# Patient Record
Sex: Female | Born: 1965 | Race: Black or African American | Hispanic: No | Smoking: Current every day smoker
Health system: Southern US, Community
[De-identification: ages and names within clinical notes are randomized; demographics above are authoritative.]

## PROBLEM LIST (undated history)

## (undated) DIAGNOSIS — C549 Malignant neoplasm of corpus uteri, unspecified: Secondary | ICD-10-CM

## (undated) DIAGNOSIS — Z5189 Encounter for other specified aftercare: Secondary | ICD-10-CM

## (undated) DIAGNOSIS — C3490 Malignant neoplasm of unspecified part of unspecified bronchus or lung: Secondary | ICD-10-CM

## (undated) DIAGNOSIS — C801 Malignant (primary) neoplasm, unspecified: Secondary | ICD-10-CM

## (undated) DIAGNOSIS — C55 Malignant neoplasm of uterus, part unspecified: Secondary | ICD-10-CM

## (undated) DIAGNOSIS — I10 Essential (primary) hypertension: Secondary | ICD-10-CM

## (undated) DIAGNOSIS — F319 Bipolar disorder, unspecified: Secondary | ICD-10-CM

## (undated) HISTORY — PX: FRACTURE SURGERY: SHX138

## (undated) HISTORY — PX: MANDIBLE FRACTURE SURGERY: SHX706

---

## 2016-05-28 ENCOUNTER — Ambulatory Visit (HOSPITAL_COMMUNITY): Payer: Self-pay

## 2016-06-01 ENCOUNTER — Encounter (HOSPITAL_COMMUNITY): Payer: Self-pay | Admitting: Emergency Medicine

## 2016-06-01 ENCOUNTER — Emergency Department (HOSPITAL_COMMUNITY)
Admission: EM | Admit: 2016-06-01 | Discharge: 2016-06-01 | Disposition: A | Discharge status: Home / Self Care | Payer: Medicare Other | Attending: Emergency Medicine | Admitting: Emergency Medicine

## 2016-06-01 ENCOUNTER — Emergency Department (HOSPITAL_COMMUNITY): Payer: Medicare Other

## 2016-06-01 DIAGNOSIS — C55 Malignant neoplasm of uterus, part unspecified: Secondary | ICD-10-CM

## 2016-06-01 DIAGNOSIS — C541 Malignant neoplasm of endometrium: Secondary | ICD-10-CM | POA: Diagnosis not present

## 2016-06-01 DIAGNOSIS — R1032 Left lower quadrant pain: Secondary | ICD-10-CM | POA: Diagnosis present

## 2016-06-01 DIAGNOSIS — I10 Essential (primary) hypertension: Secondary | ICD-10-CM | POA: Insufficient documentation

## 2016-06-01 DIAGNOSIS — F17210 Nicotine dependence, cigarettes, uncomplicated: Secondary | ICD-10-CM | POA: Diagnosis not present

## 2016-06-01 HISTORY — DX: Essential (primary) hypertension: I10

## 2016-06-01 LAB — COMPREHENSIVE METABOLIC PANEL
ALBUMIN: 2.7 g/dL — AB (ref 3.5–5.0)
ALT: 6 U/L — AB (ref 14–54)
AST: 9 U/L — AB (ref 15–41)
Alkaline Phosphatase: 79 U/L (ref 38–126)
Anion gap: 10 (ref 5–15)
BILIRUBIN TOTAL: 0.5 mg/dL (ref 0.3–1.2)
BUN: 9 mg/dL (ref 6–20)
CHLORIDE: 96 mmol/L — AB (ref 101–111)
CO2: 26 mmol/L (ref 22–32)
CREATININE: 0.42 mg/dL — AB (ref 0.44–1.00)
Calcium: 8.9 mg/dL (ref 8.9–10.3)
GFR calc Af Amer: >60 mL/min (ref >60)
Glucose, Bld: 91 mg/dL (ref 65–99)
POTASSIUM: 3.5 mmol/L (ref 3.5–5.1)
Sodium: 132 mmol/L — ABNORMAL LOW (ref 135–145)
TOTAL PROTEIN: 7.4 g/dL (ref 6.5–8.1)

## 2016-06-01 LAB — URINALYSIS, ROUTINE W REFLEX MICROSCOPIC
Bilirubin Urine: NEGATIVE
GLUCOSE, UA: NEGATIVE mg/dL
KETONES UR: NEGATIVE mg/dL
Nitrite: NEGATIVE
PROTEIN: NEGATIVE mg/dL
Specific Gravity, Urine: 1.015 (ref 1.005–1.030)
pH: 6 (ref 5.0–8.0)

## 2016-06-01 LAB — CBC WITH DIFFERENTIAL/PLATELET
BASOS ABS: 0 10*3/uL (ref 0.0–0.1)
BASOS PCT: 0 %
Eosinophils Absolute: 0 10*3/uL (ref 0.0–0.7)
Eosinophils Relative: 0 %
HEMATOCRIT: 28.4 % — AB (ref 36.0–46.0)
Hemoglobin: 9.1 g/dL — ABNORMAL LOW (ref 12.0–15.0)
LYMPHS PCT: 9 %
Lymphs Abs: 2.1 10*3/uL (ref 0.7–4.0)
MCH: 26 pg (ref 26.0–34.0)
MCHC: 32 g/dL (ref 30.0–36.0)
MCV: 81.1 fL (ref 78.0–100.0)
Monocytes Absolute: 1.7 10*3/uL — ABNORMAL HIGH (ref 0.1–1.0)
Monocytes Relative: 7 %
NEUTROS ABS: 21.1 10*3/uL — AB (ref 1.7–7.7)
NEUTROS PCT: 84 %
Platelets: 535 10*3/uL — ABNORMAL HIGH (ref 150–400)
RBC: 3.5 MIL/uL — AB (ref 3.87–5.11)
RDW: 16.6 % — ABNORMAL HIGH (ref 11.5–15.5)
WBC: 25 10*3/uL — AB (ref 4.0–10.5)

## 2016-06-01 MED ORDER — IOPAMIDOL (ISOVUE-300) INJECTION 61%
INTRAVENOUS | Status: AC
  Administered 2016-06-01: 30 mL
  Filled 2016-06-01: qty 30

## 2016-06-01 MED ORDER — DOCUSATE SODIUM 100 MG PO CAPS: 100.0000 mg | ORAL_CAPSULE | Freq: Two times a day (BID) | ORAL | 0 refills | Status: AC

## 2016-06-01 MED ORDER — ONDANSETRON HCL 4 MG/2ML IJ SOLN
4.0000 mg | Freq: Once | INTRAMUSCULAR | Status: AC
  Administered 2016-06-01: 4 mg via INTRAVENOUS
  Filled 2016-06-01: qty 2

## 2016-06-01 MED ORDER — IOPAMIDOL (ISOVUE-300) INJECTION 61%
100.0000 mL | Freq: Once | INTRAVENOUS | Status: AC | PRN
  Administered 2016-06-01: 100 mL via INTRAVENOUS

## 2016-06-01 MED ORDER — OXYCODONE-ACETAMINOPHEN 5-325 MG PO TABS: 1.0000 | ORAL_TABLET | Freq: Three times a day (TID) | ORAL | 0 refills | Status: DC | PRN

## 2016-06-01 MED ORDER — OXYCODONE-ACETAMINOPHEN 5-325 MG PO TABS
1.0000 | ORAL_TABLET | Freq: Once | ORAL | Status: AC
  Administered 2016-06-01: 1 via ORAL
  Filled 2016-06-01: qty 1

## 2016-06-01 MED ORDER — HYDROMORPHONE HCL 1 MG/ML IJ SOLN
1.0000 mg | Freq: Once | INTRAMUSCULAR | Status: AC
  Administered 2016-06-01: 1 mg via INTRAVENOUS
  Filled 2016-06-01: qty 1

## 2016-06-01 MED ORDER — SODIUM CHLORIDE 0.9 % IV BOLUS (SEPSIS)
1000.0000 mL | Freq: Once | INTRAVENOUS | Status: AC
  Administered 2016-06-01: 1000 mL via INTRAVENOUS

## 2016-06-01 NOTE — ED Provider Notes (Signed)
[facility] DEPT Provider Note   CSN: [id number] Arrival date & time: 06/01/16  0920  By signing my name below, I, Mayer Masker, attest that this documentation has been prepared under the direction and in the presence of Merrily Pew, MD. Electronically Signed: Mayer Masker, Scribe. 06/01/16. 12:02 PM.  History   Chief Complaint Chief Complaint  Patient presents with  . Abdominal Pain   HPI Comments: Valerie Gonzales is a 51 y.o. female with a h/o bipolar disorder and uterine cancer who presents to the Emergency Department complaining of lower abdominal pain with greater pain to the LLQ abdomen x ~2-3 months. Pt notes her pain is worse during the night ~3 am. She states the pain is worsening and it also radiates around to her back.She states she had a syncopal episode in February 2018 and her pain started then She reports associated green and yellow vaginal discharge, intermittent nausea, weight loss, and decreased appetite. She denies difficulty urinating, dysuria, fever, chills, diaphoresis, vision changes, CP, vomiting, diarrhea, constipation, rashes, and numbness/tingling.  Patient was in the ED at [facility] 3 times for her symptoms. One of her visits resulted in an admission where she received a blood transfusion. 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 Pt recently completed a regimen of doxycycline. She also states she is having difficulty attending her oncologist appointments due to lack of transportation. Pt is currently homeless.       The history is provided by the patient. No language interpreter was used.    Past Medical History:  Diagnosis Date  . Hypertension     There are no active problems to display for this patient.   Past Surgical History:  Procedure Laterality Date  . CESAREAN SECTION    . DILATION AND CURETTAGE OF UTERUS    . MANDIBLE FRACTURE SURGERY      OB History    Gravida Para Term  Preterm AB Living   3       0 3   SAB TAB Ectopic Multiple Live Births   0               Home Medications    Prior to Admission medications   Medication Sig Start Date End Date Taking? Authorizing Provider  docusate sodium (COLACE) 100 MG capsule Take 1 capsule (100 mg total) by mouth every 12 (twelve) hours. 06/01/16   Merrily Pew, MD  oxyCODONE-acetaminophen (PERCOCET) 5-325 MG tablet Take 1-2 tablets by mouth every 8 (eight) hours as needed. 06/01/16   Merrily Pew, MD    Family History History reviewed. No pertinent family history.  Social History Social History  Substance Use Topics  . Smoking status: Current Every Day Smoker    Packs/day: 0.50    Types: Cigarettes  . Smokeless tobacco: Never Used  . Alcohol use No     Allergies   Patient has no known allergies.   Review of Systems Review of Systems  Constitutional: Positive for appetite change. Negative for chills, diaphoresis and fever.  Eyes: Negative for visual disturbance.  Cardiovascular: Negative for chest pain.  Gastrointestinal: Positive for abdominal pain (LLQ) and nausea (intermittent). Negative for constipation, diarrhea and vomiting.  Genitourinary: Positive for vaginal discharge (green and yellow). Negative for difficulty urinating and dysuria.  Musculoskeletal: Positive for back pain.  Skin: Negative for rash.  Neurological: Negative for numbness.     Physical Exam Updated Vital Signs BP (!) 156/92 (BP Location: Left Arm)  Pulse 90   Temp 98.3 F (36.8 C) (Oral)   Resp 20   Ht '5\' 3"'$  (1.6 m)   Wt 150 lb (68 kg)   SpO2 97%   BMI 26.57 kg/m   Physical Exam  Constitutional: She is oriented to person, place, and time. She appears well-developed and well-nourished.  HENT:  Head: Normocephalic and atraumatic.  Cardiovascular: Normal rate, regular rhythm and intact distal pulses.  Exam reveals no gallop and no friction rub.   Murmur heard. 3/6 systolic murmur; otherwise normal     Pulmonary/Chest: Effort normal and breath sounds normal. No respiratory distress. She has no wheezes. She has no rales. She exhibits no tenderness.  Abdominal: Soft. She exhibits mass. There is tenderness. There is guarding.  Diffuse tenderness  Worse epigastric and suprapubic region Some guarding in suprapubic area  Palpable Tender mass midline from umbilicus    Neurological: She is alert and oriented to person, place, and time.  Skin: Skin is warm and dry.  Psychiatric: She has a normal mood and affect.  Nursing note and vitals reviewed.    ED Treatments / Results  DIAGNOSTIC STUDIES: Oxygen Saturation is 98% on RA, normal by my interpretation.    COORDINATION OF CARE: 10:44 AM Discussed treatment plan with pt at bedside and pt agreed to plan.  Labs (all labs ordered are listed, but only abnormal results are displayed) Labs Reviewed  CBC WITH DIFFERENTIAL/PLATELET - Abnormal; Notable for the following:       Result Value   WBC 25.0 (*)    RBC 3.50 (*)    Hemoglobin 9.1 (*)    HCT 28.4 (*)    RDW 16.6 (*)    Platelets 535 (*)    Neutro Abs 21.1 (*)    Monocytes Absolute 1.7 (*)    All other components within normal limits  COMPREHENSIVE METABOLIC PANEL - Abnormal; Notable for the following:    Sodium 132 (*)    Chloride 96 (*)    Creatinine, Ser 0.42 (*)    Albumin 2.7 (*)    AST 9 (*)    ALT 6 (*)    All other components within normal limits  URINALYSIS, ROUTINE W REFLEX MICROSCOPIC - Abnormal; Notable for the following:    Hgb urine dipstick SMALL (*)    Leukocytes, UA LARGE (*)    Bacteria, UA RARE (*)    Squamous Epithelial / LPF 0-5 (*)    All other components within normal limits    EKG  EKG Interpretation None       Radiology Ct Chest W Contrast  Result Date: 06/01/2016 CLINICAL DATA:  Lower abdominal pain for 2-3 months. Vaginal discharge. Nausea and weight loss. History of metastatic endometrial cancer, on treatment for palliative radiation.  EXAM: CT CHEST, ABDOMEN, AND PELVIS WITH CONTRAST TECHNIQUE: Multidetector CT imaging of the chest, abdomen and pelvis was performed following the standard protocol during bolus administration of intravenous contrast. CONTRAST:  66m ISOVUE-300 IOPAMIDOL (ISOVUE-300) INJECTION 61%, 1016mISOVUE-300 IOPAMIDOL (ISOVUE-300) INJECTION 61% COMPARISON:  CT abdomen and pelvis May 17, 2016 FINDINGS: CT CHEST FINDINGS CARDIOVASCULAR: Heart size is normal. Small pericardial effusion. Thoracic aorta is normal course and caliber, unremarkable. MEDIASTINUM/NODES: No mediastinal mass. No lymphadenopathy by CT size criteria. Normal appearance of thoracic esophagus though not tailored for evaluation. LUNGS/PLEURA: Tracheobronchial tree is patent, no pneumothorax. At least 4 RIGHT and 9 LEFT hypoenhancing pulmonary metastasis, largest in RIGHT lower lobe measuring 5 x 4.2 cm, largest on the LEFT measuring 3.8  x 3 cm and lower lobes. Masses are predominately subpleural. No pleural effusion or focal consolidation. Bibasilar atelectasis. MUSCULOSKELETAL: Partially imaged breast demonstrate bulky density, possible masses. Moderate degenerative change of LEFT shoulder. No acute osseous process or destructive bony lesions. CT ABDOMEN AND PELVIS FINDINGS HEPATOBILIARY: Liver and gallbladder are normal. PANCREAS: Normal. SPLEEN: Normal. ADRENALS/URINARY TRACT: Kidneys are orthotopic, demonstrating symmetric enhancement. No nephrolithiasis, hydronephrosis or solid renal masses. Subcentimeter RIGHT lower pole probable an malalignment. The unopacified ureters are normal in course and caliber. Delayed imaging through the kidneys demonstrates symmetric prompt contrast excretion within the proximal urinary collecting system. Urinary bladder is partially distended and unremarkable. Normal adrenal glands. STOMACH/BOWEL: The stomach, small and large bowel are normal in course and caliber without inflammatory changes. Normal appendix.  VASCULAR/LYMPHATIC: Aortoiliac vessels are normal in course and caliber. Retroperitoneal lymphadenopathy, including 2.9 x 2.3 cm LEFT periaortic lymph node, smaller LEFT greater than RIGHT iliac chain lymph nodes. 3.3 cm LEFT sidewall lymph node with smaller bilateral internal and external iliac chain lymphadenopathy. REPRODUCTIVE: Fluid-filled vagina. Distended irregular endometrium to at least 7.2 cm with probable hematocrit level. Uterus is enlarged, at least 2.1 cm in cranial caudad dimension extending above the emboli kiss. Status post tubal ligation. Small calcified uterine leiomyoma. Probable 2.1 cm LEFT adnexal cyst. OTHER: No intraperitoneal free fluid or free air. MUSCULOSKELETAL: L5 lytic lesion with less than 25% ventral height loss, with new linear lucency through inferior endplate. Severe lower lumbar facet arthropathy. IMPRESSION: CT CHEST: Multiple old pulmonary metastasis without acute cardiopulmonary process. Possible breast masses, suspicious for metastatic disease. CT ABDOMEN AND PELVIS: Enlarged uterus with distended endometrium, increased from prior CT consistent with progression of patient's known primary endometrial carcinoma. Retroperitoneal/iliac chain lymphadenopathy consistent with metastatic disease. New mild L5 pathologic fracture. Electronically Signed   By: Elon Alas M.D.   On: 06/01/2016 15:52   Ct Abdomen Pelvis W Contrast  Result Date: 06/01/2016 CLINICAL DATA:  Lower abdominal pain for 2-3 months. Vaginal discharge. Nausea and weight loss. History of metastatic endometrial cancer, on treatment for palliative radiation. EXAM: CT CHEST, ABDOMEN, AND PELVIS WITH CONTRAST TECHNIQUE: Multidetector CT imaging of the chest, abdomen and pelvis was performed following the standard protocol during bolus administration of intravenous contrast. CONTRAST:  65m ISOVUE-300 IOPAMIDOL (ISOVUE-300) INJECTION 61%, 106mISOVUE-300 IOPAMIDOL (ISOVUE-300) INJECTION 61% COMPARISON:  CT  abdomen and pelvis May 17, 2016 FINDINGS: CT CHEST FINDINGS CARDIOVASCULAR: Heart size is normal. Small pericardial effusion. Thoracic aorta is normal course and caliber, unremarkable. MEDIASTINUM/NODES: No mediastinal mass. No lymphadenopathy by CT size criteria. Normal appearance of thoracic esophagus though not tailored for evaluation. LUNGS/PLEURA: Tracheobronchial tree is patent, no pneumothorax. At least 4 RIGHT and 9 LEFT hypoenhancing pulmonary metastasis, largest in RIGHT lower lobe measuring 5 x 4.2 cm, largest on the LEFT measuring 3.8 x 3 cm and lower lobes. Masses are predominately subpleural. No pleural effusion or focal consolidation. Bibasilar atelectasis. MUSCULOSKELETAL: Partially imaged breast demonstrate bulky density, possible masses. Moderate degenerative change of LEFT shoulder. No acute osseous process or destructive bony lesions. CT ABDOMEN AND PELVIS FINDINGS HEPATOBILIARY: Liver and gallbladder are normal. PANCREAS: Normal. SPLEEN: Normal. ADRENALS/URINARY TRACT: Kidneys are orthotopic, demonstrating symmetric enhancement. No nephrolithiasis, hydronephrosis or solid renal masses. Subcentimeter RIGHT lower pole probable an malalignment. The unopacified ureters are normal in course and caliber. Delayed imaging through the kidneys demonstrates symmetric prompt contrast excretion within the proximal urinary collecting system. Urinary bladder is partially distended and unremarkable. Normal adrenal glands. STOMACH/BOWEL: The stomach, small and large  bowel are normal in course and caliber without inflammatory changes. Normal appendix. VASCULAR/LYMPHATIC: Aortoiliac vessels are normal in course and caliber. Retroperitoneal lymphadenopathy, including 2.9 x 2.3 cm LEFT periaortic lymph node, smaller LEFT greater than RIGHT iliac chain lymph nodes. 3.3 cm LEFT sidewall lymph node with smaller bilateral internal and external iliac chain lymphadenopathy. REPRODUCTIVE: Fluid-filled vagina. Distended  irregular endometrium to at least 7.2 cm with probable hematocrit level. Uterus is enlarged, at least 2.1 cm in cranial caudad dimension extending above the emboli kiss. Status post tubal ligation. Small calcified uterine leiomyoma. Probable 2.1 cm LEFT adnexal cyst. OTHER: No intraperitoneal free fluid or free air. MUSCULOSKELETAL: L5 lytic lesion with less than 25% ventral height loss, with new linear lucency through inferior endplate. Severe lower lumbar facet arthropathy. IMPRESSION: CT CHEST: Multiple old pulmonary metastasis without acute cardiopulmonary process. Possible breast masses, suspicious for metastatic disease. CT ABDOMEN AND PELVIS: Enlarged uterus with distended endometrium, increased from prior CT consistent with progression of patient's known primary endometrial carcinoma. Retroperitoneal/iliac chain lymphadenopathy consistent with metastatic disease. New mild L5 pathologic fracture. Electronically Signed   By: Elon Alas M.D.   On: 06/01/2016 15:52    Procedures Procedures (including critical care time)  Medications Ordered in ED Medications  sodium chloride 0.9 % bolus 1,000 mL (0 mLs Intravenous Stopped 06/01/16 1505)  ondansetron (ZOFRAN) injection 4 mg (4 mg Intravenous Given 06/01/16 1031)  HYDROmorphone (DILAUDID) injection 1 mg (1 mg Intravenous Given 06/01/16 1032)  iopamidol (ISOVUE-300) 61 % injection (30 mLs  Contrast Given 06/01/16 1513)  iopamidol (ISOVUE-300) 61 % injection 100 mL (100 mLs Intravenous Contrast Given 06/01/16 1514)  oxyCODONE-acetaminophen (PERCOCET/ROXICET) 5-325 MG per tablet 1 tablet (1 tablet Oral Given 06/01/16 1641)     Initial Impression / Assessment and Plan / ED Course  I have reviewed the triage vital signs and the nursing notes.  Pertinent labs & imaging results that were available during my care of the patient were reviewed by me and considered in my medical decision making (see chart for details).     Worsening cancer. Pain meds  rx. Needs to see oncology. No acute indication for admission.   Final Clinical Impressions(s) / ED Diagnoses   Final diagnoses:  Malignant neoplasm of uterus, unspecified site [facility])    New Prescriptions New Prescriptions   DOCUSATE SODIUM (COLACE) 100 MG CAPSULE    Take 1 capsule (100 mg total) by mouth every 12 (twelve) hours.   OXYCODONE-ACETAMINOPHEN (PERCOCET) 5-325 MG TABLET    Take 1-2 tablets by mouth every 8 (eight) hours as needed.   I personally performed the services described in this documentation, which was scribed in my presence. The recorded information has been reviewed and is accurate.      Merrily Pew, MD 06/01/16 1650

## 2016-06-01 NOTE — ED Notes (Signed)
Patient ambulated to bathroom with no assistance or difficulty.

## 2016-06-01 NOTE — ED Notes (Signed)
Gave patient ice chips as requested and approved by Dr Dayna Barker.

## 2016-06-01 NOTE — ED Triage Notes (Signed)
PT states recent [facility] ED visits x3 with new dx of uterine cancer and unknown if other metastasis areas. PT states unbearable pain in the past few days and pt is unsure who to follow up with for possible cancer treatments.

## 2016-06-11 ENCOUNTER — Other Ambulatory Visit (HOSPITAL_COMMUNITY): Payer: Self-pay | Admitting: Oncology

## 2016-06-11 ENCOUNTER — Emergency Department (HOSPITAL_COMMUNITY): Payer: Medicare Other

## 2016-06-11 ENCOUNTER — Encounter (HOSPITAL_COMMUNITY): Payer: Self-pay

## 2016-06-11 ENCOUNTER — Inpatient Hospital Stay (HOSPITAL_COMMUNITY)
Admission: EM | Admit: 2016-06-11 | Discharge: 2016-06-16 | DRG: 854 | Disposition: A | Discharge status: Home / Self Care | Payer: Medicare Other | Attending: Internal Medicine | Admitting: Internal Medicine

## 2016-06-11 DIAGNOSIS — Z85118 Personal history of other malignant neoplasm of bronchus and lung: Secondary | ICD-10-CM | POA: Diagnosis not present

## 2016-06-11 DIAGNOSIS — Z515 Encounter for palliative care: Secondary | ICD-10-CM | POA: Diagnosis not present

## 2016-06-11 DIAGNOSIS — D649 Anemia, unspecified: Secondary | ICD-10-CM | POA: Diagnosis present

## 2016-06-11 DIAGNOSIS — I10 Essential (primary) hypertension: Secondary | ICD-10-CM | POA: Diagnosis present

## 2016-06-11 DIAGNOSIS — C549 Malignant neoplasm of corpus uteri, unspecified: Secondary | ICD-10-CM | POA: Diagnosis present

## 2016-06-11 DIAGNOSIS — D72829 Elevated white blood cell count, unspecified: Secondary | ICD-10-CM | POA: Diagnosis present

## 2016-06-11 DIAGNOSIS — Z590 Homelessness: Secondary | ICD-10-CM | POA: Diagnosis not present

## 2016-06-11 DIAGNOSIS — M8448XA Pathological fracture, other site, initial encounter for fracture: Secondary | ICD-10-CM | POA: Diagnosis present

## 2016-06-11 DIAGNOSIS — A419 Sepsis, unspecified organism: Principal | ICD-10-CM | POA: Diagnosis present

## 2016-06-11 DIAGNOSIS — F17210 Nicotine dependence, cigarettes, uncomplicated: Secondary | ICD-10-CM | POA: Diagnosis present

## 2016-06-11 DIAGNOSIS — F319 Bipolar disorder, unspecified: Secondary | ICD-10-CM | POA: Diagnosis present

## 2016-06-11 DIAGNOSIS — N719 Inflammatory disease of uterus, unspecified: Secondary | ICD-10-CM | POA: Diagnosis present

## 2016-06-11 DIAGNOSIS — C541 Malignant neoplasm of endometrium: Secondary | ICD-10-CM

## 2016-06-11 DIAGNOSIS — Z7189 Other specified counseling: Secondary | ICD-10-CM

## 2016-06-11 DIAGNOSIS — R197 Diarrhea, unspecified: Secondary | ICD-10-CM

## 2016-06-11 DIAGNOSIS — C7982 Secondary malignant neoplasm of genital organs: Secondary | ICD-10-CM | POA: Diagnosis present

## 2016-06-11 DIAGNOSIS — R112 Nausea with vomiting, unspecified: Secondary | ICD-10-CM | POA: Diagnosis present

## 2016-06-11 DIAGNOSIS — R1084 Generalized abdominal pain: Secondary | ICD-10-CM

## 2016-06-11 HISTORY — DX: Malignant neoplasm of corpus uteri, unspecified: C54.9

## 2016-06-11 HISTORY — DX: Bipolar disorder, unspecified: F31.9

## 2016-06-11 HISTORY — DX: Malignant neoplasm of unspecified part of unspecified bronchus or lung: C34.90

## 2016-06-11 HISTORY — DX: Encounter for other specified aftercare: Z51.89

## 2016-06-11 HISTORY — DX: Malignant (primary) neoplasm, unspecified: C80.1

## 2016-06-11 HISTORY — DX: Malignant neoplasm of uterus, part unspecified: C55

## 2016-06-11 LAB — LACTIC ACID, PLASMA
Lactic Acid, Venous: 2.5 mmol/L (ref 0.5–1.9)
Lactic Acid, Venous: 2.6 mmol/L (ref 0.5–1.9)

## 2016-06-11 LAB — URINALYSIS, ROUTINE W REFLEX MICROSCOPIC
Glucose, UA: NEGATIVE mg/dL
Hgb urine dipstick: NEGATIVE
KETONES UR: 5 mg/dL — AB
Nitrite: NEGATIVE
PH: 5 (ref 5.0–8.0)
Protein, ur: 30 mg/dL — AB
Specific Gravity, Urine: 1.045 — ABNORMAL HIGH (ref 1.005–1.030)

## 2016-06-11 LAB — COMPREHENSIVE METABOLIC PANEL
ALBUMIN: 2.5 g/dL — AB (ref 3.5–5.0)
ALT: 6 U/L — AB (ref 14–54)
AST: 13 U/L — AB (ref 15–41)
Alkaline Phosphatase: 94 U/L (ref 38–126)
Anion gap: 11 (ref 5–15)
BILIRUBIN TOTAL: 0.5 mg/dL (ref 0.3–1.2)
BUN: 7 mg/dL (ref 6–20)
CHLORIDE: 98 mmol/L — AB (ref 101–111)
CO2: 24 mmol/L (ref 22–32)
CREATININE: 0.49 mg/dL (ref 0.44–1.00)
Calcium: 8.9 mg/dL (ref 8.9–10.3)
GFR calc Af Amer: >60 mL/min (ref >60)
GLUCOSE: 101 mg/dL — AB (ref 65–99)
Potassium: 4.1 mmol/L (ref 3.5–5.1)
Sodium: 133 mmol/L — ABNORMAL LOW (ref 135–145)
TOTAL PROTEIN: 7.4 g/dL (ref 6.5–8.1)

## 2016-06-11 LAB — CBC WITH DIFFERENTIAL/PLATELET
BASOS ABS: 0 10*3/uL (ref 0.0–0.1)
Basophils Relative: 0 %
Eosinophils Absolute: 0 10*3/uL (ref 0.0–0.7)
Eosinophils Relative: 0 %
HEMATOCRIT: 33.1 % — AB (ref 36.0–46.0)
Hemoglobin: 10.7 g/dL — ABNORMAL LOW (ref 12.0–15.0)
LYMPHS PCT: 5 %
Lymphs Abs: 1.7 10*3/uL (ref 0.7–4.0)
MCH: 26.2 pg (ref 26.0–34.0)
MCHC: 32.3 g/dL (ref 30.0–36.0)
MCV: 80.9 fL (ref 78.0–100.0)
Monocytes Absolute: 1.3 10*3/uL — ABNORMAL HIGH (ref 0.1–1.0)
Monocytes Relative: 4 %
NEUTROS ABS: 29.2 10*3/uL — AB (ref 1.7–7.7)
NEUTROS PCT: 91 %
Platelets: 654 10*3/uL — ABNORMAL HIGH (ref 150–400)
RBC: 4.09 MIL/uL (ref 3.87–5.11)
RDW: 16.3 % — ABNORMAL HIGH (ref 11.5–15.5)
WBC: 32.2 10*3/uL — AB (ref 4.0–10.5)

## 2016-06-11 LAB — I-STAT BETA HCG BLOOD, ED (MC, WL, AP ONLY): HCG, QUANTITATIVE: 6.7 m[IU]/mL — AB (ref <5)

## 2016-06-11 LAB — RETICULOCYTES
RBC.: 4.1 MIL/uL (ref 3.87–5.11)
RETIC COUNT ABSOLUTE: 45.1 10*3/uL (ref 19.0–186.0)
Retic Ct Pct: 1.1 % (ref 0.4–3.1)

## 2016-06-11 LAB — LIPASE, BLOOD: LIPASE: 12 U/L (ref 11–51)

## 2016-06-11 MED ORDER — ONDANSETRON HCL 4 MG/2ML IJ SOLN
4.0000 mg | Freq: Four times a day (QID) | INTRAMUSCULAR | Status: DC | PRN
  Administered 2016-06-15: 4 mg via INTRAVENOUS
  Filled 2016-06-11: qty 2

## 2016-06-11 MED ORDER — SODIUM CHLORIDE 0.9 % IV SOLN
INTRAVENOUS | Status: DC
  Administered 2016-06-11 – 2016-06-16 (×7): via INTRAVENOUS

## 2016-06-11 MED ORDER — ONDANSETRON HCL 4 MG/2ML IJ SOLN
4.0000 mg | Freq: Once | INTRAMUSCULAR | Status: AC
  Administered 2016-06-11: 4 mg via INTRAVENOUS
  Filled 2016-06-11: qty 2

## 2016-06-11 MED ORDER — SODIUM CHLORIDE 0.9 % IV BOLUS (SEPSIS)
500.0000 mL | Freq: Once | INTRAVENOUS | Status: AC
  Administered 2016-06-11: 500 mL via INTRAVENOUS

## 2016-06-11 MED ORDER — ACETAMINOPHEN 325 MG PO TABS
650.0000 mg | ORAL_TABLET | Freq: Four times a day (QID) | ORAL | Status: DC | PRN
  Administered 2016-06-11 – 2016-06-16 (×14): 650 mg via ORAL
  Filled 2016-06-11 (×15): qty 2

## 2016-06-11 MED ORDER — SENNOSIDES-DOCUSATE SODIUM 8.6-50 MG PO TABS: 1.0000 | ORAL_TABLET | Freq: Every evening | ORAL | Status: DC | PRN

## 2016-06-11 MED ORDER — MORPHINE SULFATE (PF) 2 MG/ML IV SOLN
1.0000 mg | INTRAVENOUS | Status: DC | PRN
  Administered 2016-06-11: 1 mg via INTRAVENOUS
  Filled 2016-06-11: qty 1

## 2016-06-11 MED ORDER — VANCOMYCIN HCL 10 G IV SOLR
1250.0000 mg | Freq: Once | INTRAVENOUS | Status: DC
  Filled 2016-06-11: qty 1250

## 2016-06-11 MED ORDER — ONDANSETRON HCL 4 MG PO TABS: 4.0000 mg | ORAL_TABLET | Freq: Four times a day (QID) | ORAL | Status: DC | PRN

## 2016-06-11 MED ORDER — METRONIDAZOLE IN NACL 5-0.79 MG/ML-% IV SOLN
500.0000 mg | Freq: Three times a day (TID) | INTRAVENOUS | Status: DC
  Administered 2016-06-11 – 2016-06-15 (×11): 500 mg via INTRAVENOUS
  Filled 2016-06-11 (×11): qty 100

## 2016-06-11 MED ORDER — MORPHINE SULFATE (PF) 2 MG/ML IV SOLN
1.0000 mg | INTRAVENOUS | Status: DC | PRN
  Administered 2016-06-11 – 2016-06-13 (×15): 1 mg via INTRAVENOUS
  Filled 2016-06-11 (×16): qty 1

## 2016-06-11 MED ORDER — ACETAMINOPHEN 650 MG RE SUPP: 650.0000 mg | Freq: Four times a day (QID) | RECTAL | Status: DC | PRN

## 2016-06-11 MED ORDER — PIPERACILLIN-TAZOBACTAM 3.375 G IVPB 30 MIN: 3.3750 g | Freq: Once | INTRAVENOUS | Status: DC

## 2016-06-11 MED ORDER — HYDROMORPHONE HCL 1 MG/ML IJ SOLN
1.0000 mg | Freq: Once | INTRAMUSCULAR | Status: AC
  Administered 2016-06-11: 1 mg via INTRAVENOUS
  Filled 2016-06-11: qty 1

## 2016-06-11 NOTE — ED Notes (Signed)
Dr Glo Herring spoke with pt about allowing staff to draw blood cultures and labs before antibiotics can be hung.  Pt continues to refuse labs being drawn.

## 2016-06-11 NOTE — ED Notes (Signed)
Pt states she cannot urinate at this time.

## 2016-06-11 NOTE — Progress Notes (Signed)
Received verbal order from Dr. Jerilee Hoh to go ahead and give dose of 1 mg of Morphine early to see how it helped the patient. Patient's pain 9 out of 10.

## 2016-06-11 NOTE — ED Notes (Signed)
During attempt of vaginal exam.  Dr Glo Herring removed attends with tissue, copious amount of foul smelling purulent drainage noted on tissues and attends.

## 2016-06-11 NOTE — H&P (Signed)
History and Physical    Valerie Gonzales WJX:[id number] DOB: November 02, 1965 DOA: 06/11/2016  Referring MD/NP/PA: Malachy Moan, EDP PCP: No PCP Per Patient  Patient coming from: home  Chief Complaint: abdominal pain, n/v  HPI: Valerie Gonzales is a 51 y.o. female with h/o bipolar disorder who is homeless and was recently diagnosed with metastatic endometrial cancer. She was admitted to [facility] recently for a ?UTI and signed out AMA. She comes back in today with the above complaints. She has not yet yet seen oncology. She has had a malodorous yellow vaginal discharge, along with low grade fevers. Her WBC count is 32.2. Seen in the ED by GYN, Dr. Glo Herring who is planning on performing a D and C in am for presumed pyometrium and requests admission for IV abx, oncology consultation and SW assistance for placement issues.  Past Medical/Surgical History: Past Medical History:  Diagnosis Date  . Bipolar 1 disorder ([place])   . Blood transfusion without reported diagnosis   . Cancer ([place])   . Hypertension   . Lung cancer ([place])   . Uterine cancer [facility])     Past Surgical History:  Procedure Laterality Date  . CESAREAN SECTION    . CESAREAN SECTION    . DILATION AND CURETTAGE OF UTERUS    . FRACTURE SURGERY    . MANDIBLE FRACTURE SURGERY      Social History:  reports that she has been smoking Cigarettes.  She has been smoking about 0.50 packs per day. She has never used smokeless tobacco. She reports that she does not drink alcohol or use drugs.  Allergies: No Known Allergies  Family History:  Patient is unaware of any medical issues in her immediate family members  Prior to Admission medications   Medication Sig Start Date End Date Taking? Authorizing Provider  acetaminophen (TYLENOL) 325 MG tablet Take 650 mg by mouth daily as needed for moderate pain.   Yes Historical Provider, MD  docusate sodium (COLACE) 100 MG capsule Take 1 capsule (100 mg total) by mouth every 12 (twelve)  hours. 06/01/16  Yes Merrily Pew, MD  oxyCODONE-acetaminophen (PERCOCET) 5-325 MG tablet Take 1-2 tablets by mouth every 8 (eight) hours as needed. Patient taking differently: Take 1-2 tablets by mouth every 8 (eight) hours as needed for moderate pain.  06/01/16  Yes Merrily Pew, MD    Review of Systems: Constitutional: Positive for fever, chills, diaphoresis, appetite change and fatigue.  HEENT: Denies photophobia, eye pain, redness, hearing loss, ear pain, congestion, sore throat, rhinorrhea, sneezing, mouth sores, trouble swallowing, neck pain, neck stiffness and tinnitus.   Respiratory: Denies SOB, DOE, cough, chest tightness,  and wheezing.   Cardiovascular: Denies chest pain, palpitations and leg swelling.  Gastrointestinal: Deniesdiarrhea, constipation, blood in stool and abdominal distention.  Genitourinary: Denies dysuria, urgency, frequency, hematuria, flank pain and difficulty urinating. Positive vaginal discharge. Endocrine: Denies: hot or cold intolerance, sweats, changes in hair or nails, polyuria, polydipsia. Musculoskeletal: Denies myalgias, back pain, joint swelling, arthralgias and gait problem.  Skin: Denies pallor, rash and wound.  Neurological: Denies dizziness, seizures, syncope, weakness, light-headedness, numbness and headaches.  Hematological: Denies adenopathy. Easy bruising, personal or family bleeding history  Psychiatric/Behavioral: Denies suicidal ideation, mood changes, confusion, nervousness, sleep disturbance and agitation    Physical Exam: Vitals:   06/11/16 0800 06/11/16 0830 06/11/16 1040 06/11/16 1100  BP: (!) 161/82 (!) 152/89 (!) 160/73   Pulse: 80 84 88   Resp:  16    Temp:   98 F (  36.7 C)   TempSrc:   Oral   SpO2: 95% 97% 97%   Weight:   67.1 kg (148 lb) 68.6 kg (151 lb 4.8 oz)  Height:   '5\' 3"'$  (1.6 m)      Constitutional: NAD, calm, comfortable, affect appears normal. Eyes: PERRL, lids and conjunctivae normal ENMT: Mucous membranes are  moist. Posterior pharynx clear of any exudate or lesions.Normal dentition.  Neck: normal, supple, no masses, no thyromegaly Respiratory: clear to auscultation bilaterally, no wheezing, no crackles. Normal respiratory effort. No accessory muscle use.  Cardiovascular: Regular rate and rhythm, no murmurs / rubs / gallops. No extremity edema. 2+ pedal pulses. No carotid bruits.  Abdomen: no tenderness, no masses palpated. No hepatosplenomegaly. Bowel sounds positive.  Musculoskeletal: no clubbing / cyanosis. No joint deformity upper and lower extremities. Good ROM, no contractures. Normal muscle tone.  Skin: no rashes, lesions, ulcers. No induration Neurologic: CN 2-12 grossly intact. Sensation intact, DTR normal. Strength 5/5 in all 4.  Psychiatric: Normal judgment and insight. Alert and oriented x 3. Normal mood.    Labs on Admission: I have personally reviewed the following labs and imaging studies  CBC:  Recent Labs Lab 06/11/16 0450  WBC 32.2*  NEUTROABS 29.2*  HGB 10.7*  HCT 33.1*  MCV 80.9  PLT 253*   Basic Metabolic Panel:  Recent Labs Lab 06/11/16 0450  NA 133*  K 4.1  CL 98*  CO2 24  GLUCOSE 101*  BUN 7  CREATININE 0.49  CALCIUM 8.9   GFR: Estimated Creatinine Clearance: 75.6 mL/min (by C-G formula based on SCr of 0.49 mg/dL). Liver Function Tests:  Recent Labs Lab 06/11/16 0450  AST 13*  ALT 6*  ALKPHOS 94  BILITOT 0.5  PROT 7.4  ALBUMIN 2.5*    Recent Labs Lab 06/11/16 0450  LIPASE 12   No results for input(s): AMMONIA in the last 168 hours. Coagulation Profile: No results for input(s): INR, PROTIME in the last 168 hours. Cardiac Enzymes: No results for input(s): CKTOTAL, CKMB, CKMBINDEX, TROPONINI in the last 168 hours. BNP (last 3 results) No results for input(s): PROBNP in the last 8760 hours. HbA1C: No results for input(s): HGBA1C in the last 72 hours. CBG: No results for input(s): GLUCAP in the last 168 hours. Lipid Profile: No  results for input(s): CHOL, HDL, LDLCALC, TRIG, CHOLHDL, LDLDIRECT in the last 72 hours. Thyroid Function Tests: No results for input(s): TSH, T4TOTAL, FREET4, T3FREE, THYROIDAB in the last 72 hours. Anemia Panel: No results for input(s): VITAMINB12, FOLATE, FERRITIN, TIBC, IRON, RETICCTPCT in the last 72 hours. Urine analysis:    Component Value Date/Time   COLORURINE AMBER (A) 06/11/2016 0615   APPEARANCEUR HAZY (A) 06/11/2016 0615   LABSPEC 1.045 (H) 06/11/2016 0615   PHURINE 5.0 06/11/2016 0615   GLUCOSEU NEGATIVE 06/11/2016 0615   HGBUR NEGATIVE 06/11/2016 0615   BILIRUBINUR MODERATE (A) 06/11/2016 0615   KETONESUR 5 (A) 06/11/2016 0615   PROTEINUR 30 (A) 06/11/2016 0615   NITRITE NEGATIVE 06/11/2016 0615   LEUKOCYTESUR LARGE (A) 06/11/2016 0615   Sepsis Labs: '@LABRCNTIP'$ (procalcitonin:4,lacticidven:4) )No results found for this or any previous visit (from the past 240 hour(s)).   Radiological Exams on Admission: Dg Abd Acute W/chest  Result Date: 06/11/2016 CLINICAL DATA:  Nausea vomiting and diarrhea for several months. Chest pain on tonight. EXAM: DG ABDOMEN ACUTE W/ 1V CHEST COMPARISON:  05/29/2016 FINDINGS: Multiple pulmonary nodules persist, probably unchanged. No airspace consolidation. No effusion. The abdominal gas pattern is normal.  No  biliary or urinary calculi. IMPRESSION: Pulmonary metastases.  No consolidation or effusion. Normal abdominal gas pattern. Electronically Signed   By: Andreas Newport M.D.   On: 06/11/2016 06:16    EKG: Independently reviewed. None obtained in ED  Assessment/Plan Principal Problem:   Endometrial cancer [facility]) Active Problems:   Sepsis ([place])   Pyometrium   Leukocytosis    Pyometrium -BC requested. -Start zosyn/flagyl to cover gram negative and anaerobe organisms. -Discussed case with Dr. Glo Herring who will perform a D and C in am.  Metastatic Endometrial Carcinoma -Request Oncology consultation. -We will need to develop a  treatment plan for her. This will be difficult given her ongoing social issues, mainly her homelessness. SW is aware and is looking into placement options for her.  Leukocytosis -Secondary to above. -Follow with abx.   DVT prophylaxis: SCDs  Code Status: full code  Family Communication: patient only  Disposition Plan: pending medical improvement and placement determination  Consults called: GYN, Oncology  Admission status: inpatient    Time Spent: 85 minutes  Lelon Frohlich MD [facility] Pager [phone]  If 7PM-7AM, please contact night-coverage [email or web address] Password [email or web address]  06/11/2016, 12:54 PM

## 2016-06-11 NOTE — ED Triage Notes (Signed)
Patient states that she is having nausea, vomiting, and diarrhea, that has been recurrent over the past several months. Patient states that she is having pain from her chest to her thighs.  Patient states that she had a D&C and they found cancer cell, but has not followed up with anyone.  Was told on April 8th about the cancer.

## 2016-06-11 NOTE — ED Notes (Signed)
Educated pt on the importance of complying with treatment ordered by MD.  At this time pt is still refusing treatment.

## 2016-06-11 NOTE — ED Provider Notes (Signed)
[facility] DEPT Provider Note   CSN: [id number] Arrival date & time: 06/11/16  0408     History   Chief Complaint Chief Complaint  Patient presents with  . Emesis    HPI Valerie Gonzales is a 51 y.o. female.  Patient presents to the emergency department by ambulance for nausea, vomiting and diarrhea. Patient reports that this has been occurring intermittently for months. Patient was recently diagnosed with endometrial cancer. She reports that she was hospitalized at [facility] this week. She is supposed to be starting radiation treatment soon, but does not have a lot of information.      Past Medical History:  Diagnosis Date  . Bipolar 1 disorder ([place])   . Blood transfusion without reported diagnosis   . Cancer ([place])   . Hypertension   . Lung cancer ([place])   . Uterine cancer ([place])     There are no active problems to display for this patient.   Past Surgical History:  Procedure Laterality Date  . CESAREAN SECTION    . CESAREAN SECTION    . DILATION AND CURETTAGE OF UTERUS    . FRACTURE SURGERY    . MANDIBLE FRACTURE SURGERY      OB History    Gravida Para Term Preterm AB Living   3       0 3   SAB TAB Ectopic Multiple Live Births   0               Home Medications    Prior to Admission medications   Medication Sig Start Date End Date Taking? Authorizing Provider  docusate sodium (COLACE) 100 MG capsule Take 1 capsule (100 mg total) by mouth every 12 (twelve) hours. 06/01/16   Merrily Pew, MD  oxyCODONE-acetaminophen (PERCOCET) 5-325 MG tablet Take 1-2 tablets by mouth every 8 (eight) hours as needed. 06/01/16   Merrily Pew, MD    Family History History reviewed. No pertinent family history.  Social History Social History  Substance Use Topics  . Smoking status: Current Every Day Smoker    Packs/day: 0.50    Types: Cigarettes  . Smokeless tobacco: Never Used  . Alcohol use No     Allergies   Patient has no known  allergies.   Review of Systems Review of Systems  Gastrointestinal: Positive for abdominal pain, diarrhea, nausea and vomiting.  All other systems reviewed and are negative.    Physical Exam Updated Vital Signs BP (!) 172/89 (BP Location: Right Arm)   Pulse 87   Temp 98.3 F (36.8 C) (Oral)   Resp 16   Ht '5\' 3"'$  (1.6 m)   Wt 148 lb (67.1 kg)   SpO2 98%   BMI 26.22 kg/m   Physical Exam  Constitutional: She is oriented to person, place, and time. She appears well-developed and well-nourished. No distress.  HENT:  Head: Normocephalic and atraumatic.  Right Ear: Hearing normal.  Left Ear: Hearing normal.  Nose: Nose normal.  Mouth/Throat: Oropharynx is clear and moist and mucous membranes are normal.  Eyes: Conjunctivae and EOM are normal. Pupils are equal, round, and reactive to light.  Neck: Normal range of motion. Neck supple.  Cardiovascular: Regular rhythm, S1 normal and S2 normal.  Exam reveals no gallop and no friction rub.   No murmur heard. Pulmonary/Chest: Effort normal and breath sounds normal. No respiratory distress. She exhibits no tenderness.  Abdominal: Soft. Normal appearance and bowel sounds are normal. There is no hepatosplenomegaly. There is generalized tenderness.  There is no rebound, no guarding, no tenderness at McBurney's point and negative Murphy's sign. No hernia.  Musculoskeletal: Normal range of motion.  Neurological: She is alert and oriented to person, place, and time. She has normal strength. No cranial nerve deficit or sensory deficit. Coordination normal. GCS eye subscore is 4. GCS verbal subscore is 5. GCS motor subscore is 6.  Skin: Skin is warm, dry and intact. No rash noted. No cyanosis.  Psychiatric: She has a normal mood and affect. Her speech is normal and behavior is normal. Thought content normal.  Nursing note and vitals reviewed.    ED Treatments / Results  Labs (all labs ordered are listed, but only abnormal results are  displayed) Labs Reviewed  CBC WITH DIFFERENTIAL/PLATELET - Abnormal; Notable for the following:       Result Value   WBC 32.2 (*)    Hemoglobin 10.7 (*)    HCT 33.1 (*)    RDW 16.3 (*)    Platelets 654 (*)    Neutro Abs 29.2 (*)    Monocytes Absolute 1.3 (*)    All other components within normal limits  COMPREHENSIVE METABOLIC PANEL - Abnormal; Notable for the following:    Sodium 133 (*)    Chloride 98 (*)    Glucose, Bld 101 (*)    Albumin 2.5 (*)    AST 13 (*)    ALT 6 (*)    All other components within normal limits  URINALYSIS, ROUTINE W REFLEX MICROSCOPIC - Abnormal; Notable for the following:    Color, Urine AMBER (*)    APPearance HAZY (*)    Specific Gravity, Urine 1.045 (*)    Bilirubin Urine MODERATE (*)    Ketones, ur 5 (*)    Protein, ur 30 (*)    Leukocytes, UA LARGE (*)    Bacteria, UA RARE (*)    Squamous Epithelial / LPF 0-5 (*)    All other components within normal limits  I-STAT BETA HCG BLOOD, ED ([facility], [facility], [facility] ONLY) - Abnormal; Notable for the following:    I-stat hCG, quantitative 6.7 (*)    All other components within normal limits  URINE CULTURE  CULTURE, BLOOD (ROUTINE X 2)  CULTURE, BLOOD (ROUTINE X 2)  URINE CULTURE  C DIFFICILE QUICK SCREEN W PCR REFLEX  LIPASE, BLOOD  LACTIC ACID, PLASMA  LACTIC ACID, PLASMA  URINALYSIS, ROUTINE W REFLEX MICROSCOPIC  I-STAT CG4 LACTIC ACID, ED    EKG  EKG Interpretation None       Radiology Dg Abd Acute W/chest  Result Date: 06/11/2016 CLINICAL DATA:  Nausea vomiting and diarrhea for several months. Chest pain on tonight. EXAM: DG ABDOMEN ACUTE W/ 1V CHEST COMPARISON:  05/29/2016 FINDINGS: Multiple pulmonary nodules persist, probably unchanged. No airspace consolidation. No effusion. The abdominal gas pattern is normal.  No biliary or urinary calculi. IMPRESSION: Pulmonary metastases.  No consolidation or effusion. Normal abdominal gas pattern. Electronically Signed   By: Andreas Newport M.D.   On:  06/11/2016 06:16    Procedures Procedures (including critical care time)  Medications Ordered in ED Medications  HYDROmorphone (DILAUDID) injection 1 mg (not administered)  ondansetron (ZOFRAN) injection 4 mg (4 mg Intravenous Given 06/11/16 0500)  sodium chloride 0.9 % bolus 500 mL (0 mLs Intravenous Stopped 06/11/16 0644)     Initial Impression / Assessment and Plan / ED Course  I have reviewed the triage vital signs and the nursing notes.  Pertinent labs & imaging results that were available  during my care of the patient were reviewed by me and considered in my medical decision making (see chart for details).     Patient presents to the emergency department for evaluation of nausea, vomiting and diarrhea. Patient has a complicated recent medical history. She was diagnosed with endometrial cancer in the last couple of months. She was seen on April 23 here in this ER for abdominal pain and CAT scan showed metastatic disease in her lungs at that time. She has significant enlargement of her uterus with necrotic regions.  In the interim patient has been admitted to [facility] twice. It appears that she has left AGAINST MEDICAL ADVICE from [place]. Therefore arranging for her to get palliative pelvic radiation therapy but she left the hospital prior to starting that. There was also some consideration for her to follow-up at the [place] at [facility] but she has not done that either.  During her hospitalization at [facility] she was treated with broad-spectrum antibiotics for urinary tract infection. Her white count has increased at each visit to the ER and hospitalization. Today her white blood cell count is 32.2. With recent antibiotics and diarrhea, will check C. Difficile.  Case discussed with Dr. Glo Herring, on call for OB/GYN. He has examined the patient here in the ER. He does not feel that she requires surgery. He is recommending admission here and he  will follow in consultation. Final Clinical Impressions(s) / ED Diagnoses   Final diagnoses:  Nausea vomiting and diarrhea  Generalized abdominal pain  Endometrial cancer [facility])    New Prescriptions New Prescriptions   No medications on file     Orpah Greek, MD 06/11/16 0730

## 2016-06-11 NOTE — ED Notes (Addendum)
Pt refusing the in and out cath. Doctor Pollina made aware and said to not worry about the cath since the pt is getting upset about it.

## 2016-06-11 NOTE — Progress Notes (Signed)
Just received patient from the ED. Patient is still refusing blood culture draws and in-and-out cath. Paged Dr. Jerilee Hoh to let her know the patient is on the floor in 309.

## 2016-06-11 NOTE — Progress Notes (Signed)
Pharmacy Antibiotic Note  Marionna Gonia is a 51 y.o. female admitted on 06/11/2016 with sepsis.  Pharmacy has been consulted for vancomycin and zosyn dosing.  Plan: Vanc 1250 mg IV x 1 then 750 q12 Zosyn 3.375 gm IV q8 hours f/u renal function, cultures and clinical course  Height: '5\' 3"'$  (160 cm) Weight: 148 lb (67.1 kg) IBW/kg (Calculated) : 52.4  Temp (24hrs), Avg:98 F (36.7 C), Min:97.7 F (36.5 C), Max:98.3 F (36.8 C)   Recent Labs Lab 06/11/16 0450  WBC 32.2*  CREATININE 0.49    Estimated Creatinine Clearance: 74.8 mL/min (by C-G formula based on SCr of 0.49 mg/dL).    No Known Allergies  Antimicrobials this admission: vanc 5/3 >>  zosyn 5/3 >>    Thank you for allowing pharmacy to be a part of this patient's care.  Braelyn Jenson, [place] 06/11/2016 10:30 AM

## 2016-06-11 NOTE — Consult Note (Signed)
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 female who is homeless, with a long-standing diagnosis of bipolar disease. She presented to the emergency room here at [facility] just a few days after signing out AMA of [place], [facility]. Evaluation notable that she is afebrile with pulse below 90, no tachypnea but leukocytosis at 32,000 with left shift. GYN exam is notable for a markedly enlarged uterus to 2 cm above the umbilicus on abdominal exam. Pelvic exam is refused by the patient but attempts at exam are notable for significant malodorous discharge suggestive of old blood. Review of CT scan obtained through the emergency room here at [facility] 4/ 23/ 2018 shows a large uterine cavity was fluid contained uterine cavity consistent with blood leukocytosis was notable at 25,000 at the time of that visit, with hemoglobin 9.1. She was subsequently seen at [facility] again and admitted with hemoglobin of 6.8 and transfused again 3 units packed cells. She had a previous admission requiring transfusion,  Review of records obtained by tom Kefalas PA-C are summarized in his very thorough note, document that she underwent endometrial biopsy by Dr. Roberto Scales 05/19/2016 which revealed malignant mixed mullerian tumorand that, and that CT of the abdomen and pelvis showed markedly enlarged uterus with distended endometrium, retroperitoneal chain lymphadenopathy, pathologic L5 fracture, as well as multiple pulmonary nodules consistent with metastatic disease. Additionally there is a breast mass that may represent metastatic disease or second primary Issues primary complaint is her pain levels are related to her pelvis particularly on the left side Pertinent Gynecological History: Menses: post-menopausal with a foul  malodorous bloody discharge Bleeding: post menopausal bleeding Contraception: abstinence DES exposure: unknown Blood transfusions: Transfused 3 units on 2 occasions at [facility] Sexually transmitted diseases: no past history Previous GYN Procedures: Endometrial biopsy 05/19/2016 Dr. Irven Coe  Last mammogram: Date:  Last pap: Date:  OB History: G, P  Menstrual History: Menarche age:  No LMP recorded. Patient is not currently having periods (Reason: Irregular Periods).    Past Medical History:  Diagnosis Date  . Bipolar 1 disorder ([place])   . Blood transfusion without reported diagnosis   . Cancer ([place])   . Hypertension   . Lung cancer ([place])   . Malignant mixed Mullerian tumor (MMMT) ([place]) 06/11/2016  . Uterine cancer [facility])     Past Surgical History:  Procedure Laterality Date  . CESAREAN SECTION    . CESAREAN SECTION    . DILATION AND CURETTAGE OF UTERUS    . FRACTURE SURGERY    . MANDIBLE FRACTURE SURGERY      History reviewed. No pertinent family history.  Social History:  reports that she has been smoking Cigarettes.  She has been smoking about 0.50 packs per day. She has never used smokeless tobacco. She reports that she does not drink alcohol or use drugs.  Allergies: No Known Allergies  Medications: I have reviewed the patient's current medications.  ROS  Blood pressure 137/71, pulse 87, temperature 98.6 F (37 C), temperature source Oral, resp. rate 18, height 5' 3" (1.6 m), weight 68.6 kg (151 lb 4.8 oz), SpO2 96 %. Physical Exam  Constitutional: She is oriented to person, place, and time. She appears well-developed and well-nourished. She appears distressed.  HENT:  Head: Normocephalic and atraumatic.  Eyes: Pupils are equal, round, and reactive to light.  Neck: Normal range  of motion.  Cardiovascular: Normal rate.   Respiratory: Effort normal.  GI: Bowel sounds are normal. She exhibits mass. There is tenderness.  Uterus is palpable 2 cm above  the umbilicus and tender to contact reproducing the left-sided pain  Genitourinary:  Genitourinary Comments: Uterus is uniformly enlarged. Patient refuses to allow pelvic exam or in and out catheterization of the bladder  Neurological: She is alert and oriented to person, place, and time.  Skin: Skin is warm.  Psychiatric: Judgment normal.  Difficult Patient to give specific answers to questions but patient is oriented and alert 3    Results for orders placed or performed during the hospital encounter of 06/11/16 (from the past 48 hour(s))  CBC with Differential/Platelet     Status: Abnormal   Collection Time: 06/11/16  4:50 AM  Result Value Ref Range   WBC 32.2 (H) 4.0 - 10.5 K/uL   RBC 4.09 3.87 - 5.11 MIL/uL   Hemoglobin 10.7 (L) 12.0 - 15.0 g/dL   HCT 33.1 (L) 36.0 - 46.0 %   MCV 80.9 78.0 - 100.0 fL   MCH 26.2 26.0 - 34.0 pg   MCHC 32.3 30.0 - 36.0 g/dL   RDW 16.3 (H) 11.5 - 15.5 %   Platelets 654 (H) 150 - 400 K/uL   Neutrophils Relative % 91 %   Neutro Abs 29.2 (H) 1.7 - 7.7 K/uL   Lymphocytes Relative 5 %   Lymphs Abs 1.7 0.7 - 4.0 K/uL   Monocytes Relative 4 %   Monocytes Absolute 1.3 (H) 0.1 - 1.0 K/uL   Eosinophils Relative 0 %   Eosinophils Absolute 0.0 0.0 - 0.7 K/uL   Basophils Relative 0 %   Basophils Absolute 0.0 0.0 - 0.1 K/uL  Comprehensive metabolic panel     Status: Abnormal   Collection Time: 06/11/16  4:50 AM  Result Value Ref Range   Sodium 133 (L) 135 - 145 mmol/L   Potassium 4.1 3.5 - 5.1 mmol/L   Chloride 98 (L) 101 - 111 mmol/L   CO2 24 22 - 32 mmol/L   Glucose, Bld 101 (H) 65 - 99 mg/dL   BUN 7 6 - 20 mg/dL   Creatinine, Ser 0.49 0.44 - 1.00 mg/dL   Calcium 8.9 8.9 - 10.3 mg/dL   Total Protein 7.4 6.5 - 8.1 g/dL   Albumin 2.5 (L) 3.5 - 5.0 g/dL   AST 13 (L) 15 - 41 U/L   ALT 6 (L) 14 - 54 U/L   Alkaline Phosphatase 94 38 - 126 U/L   Total Bilirubin 0.5 0.3 - 1.2 mg/dL   GFR calc non Af Amer >60 >60 mL/min   GFR calc Af Amer >60 >60 mL/min     Comment: (NOTE) The eGFR has been calculated using the CKD EPI equation. This calculation has not been validated in all clinical situations. eGFR's persistently <60 mL/min signify possible Chronic Kidney Disease.    Anion gap 11 5 - 15  Lipase, blood     Status: None   Collection Time: 06/11/16  4:50 AM  Result Value Ref Range   Lipase 12 11 - 51 U/L  Reticulocytes     Status: None   Collection Time: 06/11/16  4:50 AM  Result Value Ref Range   Retic Ct Pct 1.1 0.4 - 3.1 %   RBC. 4.10 3.87 - 5.11 MIL/uL   Retic Count, Manual 45.1 19.0 - 186.0 K/uL  I-Stat Beta hCG blood, ED ([facility], [facility], [facility] only)  Status: Abnormal   Collection Time: 06/11/16  5:11 AM  Result Value Ref Range   I-stat hCG, quantitative 6.7 (H) <5 mIU/mL   Comment 3            Comment:   GEST. AGE      CONC.  (mIU/mL)   <=1 WEEK        5 - 50     2 WEEKS       50 - 500     3 WEEKS       100 - 10,000     4 WEEKS     1,000 - 30,000        FEMALE AND NON-PREGNANT FEMALE:     LESS THAN 5 mIU/mL   Urinalysis, Routine w reflex microscopic     Status: Abnormal   Collection Time: 06/11/16  6:15 AM  Result Value Ref Range   Color, Urine AMBER (A) YELLOW    Comment: BIOCHEMICALS MAY BE AFFECTED BY COLOR   APPearance HAZY (A) CLEAR   Specific Gravity, Urine 1.045 (H) 1.005 - 1.030   pH 5.0 5.0 - 8.0   Glucose, UA NEGATIVE NEGATIVE mg/dL   Hgb urine dipstick NEGATIVE NEGATIVE   Bilirubin Urine MODERATE (A) NEGATIVE   Ketones, ur 5 (A) NEGATIVE mg/dL   Protein, ur 30 (A) NEGATIVE mg/dL   Nitrite NEGATIVE NEGATIVE   Leukocytes, UA LARGE (A) NEGATIVE   RBC / HPF 0-5 0 - 5 RBC/hpf   WBC, UA TOO NUMEROUS TO COUNT 0 - 5 WBC/hpf   Bacteria, UA RARE (A) NONE SEEN   Squamous Epithelial / LPF 0-5 (A) NONE SEEN   Mucous PRESENT   Lactic acid, plasma     Status: Abnormal   Collection Time: 06/11/16 12:15 PM  Result Value Ref Range   Lactic Acid, Venous 2.5 (HH) 0.5 - 1.9 mmol/L    Comment: CRITICAL RESULT CALLED TO, READ  BACK BY AND VERIFIED WITH: BIVENS L. AT 1342 ON [id number] BY THOMPSON S.   Culture, blood (routine x 2)     Status: None (Preliminary result)   Collection Time: 06/11/16 12:15 PM  Result Value Ref Range   Specimen Description BLOOD RIGHT HAND    Special Requests      BOTTLES DRAWN AEROBIC AND ANAEROBIC Blood Culture adequate volume   Culture PENDING    Report Status PENDING     Dg Abd Acute W/chest  Result Date: 06/11/2016 CLINICAL DATA:  Nausea vomiting and diarrhea for several months. Chest pain on tonight. EXAM: DG ABDOMEN ACUTE W/ 1V CHEST COMPARISON:  05/29/2016 FINDINGS: Multiple pulmonary nodules persist, probably unchanged. No airspace consolidation. No effusion. The abdominal gas pattern is normal.  No biliary or urinary calculi. IMPRESSION: Pulmonary metastases.  No consolidation or effusion. Normal abdominal gas pattern. Electronically Signed   By: Andreas Newport M.D.   On: 06/11/2016 06:16    Assessment/Plan: 1. Malignant mixed mullerian tumor with suspected pyometrium. Have discussed the case with Dr. Everitt Amber GYN oncologist who supports our plan to proceed with suction D&C to try to reduce the endometrial infection and tumor mass that is likely blocking the endometrial cavity and preventing adequate drainage. We will proceed very carefully with suction D&C to the obvious risk for recurrent endometrial cavity bleeding. Type and screen is ordered we'll continue IV antibiotics until clinical response obtained 2. Dr. Denman George suggest that palliative care may need to be involved from the start as prognosis here for clinical response is very low  3. Will hope for reduced pain wants endometrial cavity is evacuated  FERGUSON,JOHN V 06/11/2016

## 2016-06-11 NOTE — ED Notes (Signed)
Pt refusing In and out cath, Dr Betsey Holiday informed.

## 2016-06-11 NOTE — Clinical Social Work Note (Signed)
Clinical Social Work Assessment  Patient Details  Name: Valerie Gonzales MRN: [id number] Date of Birth: Jan 25, 1966  Date of referral:  06/11/16               Reason for consult:  Discharge Planning                Permission sought to share information with:    Permission granted to share information::     Name::        Agency::     Relationship::     Contact Information:     Housing/Transportation Living arrangements for the past 2 months:  Hotel/Motel Source of Information:  Patient Patient Interpreter Needed:  None Criminal Activity/Legal Involvement Pertinent to Current Situation/Hospitalization:  No - Comment as needed Significant Relationships:  Other Family Members Lives with:  Self Do you feel safe going back to the place where you live?  Yes Need for family participation in patient care:  Yes (Comment)  Care giving concerns:  None identified at baseline.   Social Worker assessment / plan:  Patient has been homeless for "not long." She states that previously she maintained housing for two years, but became ill and was unable to maintain her housing. Patient states that her family supports are "unavailable." Patient stated that she gets a small monthly check, she would not disclose the amount. Patient stated that she is interested in ALF or [facility]. She gave permission for her clinical information to be faxed out to facilities in the area.   Employment status:  Disabled (Comment on whether or not currently receiving Disability) Insurance information:  Medicare PT Recommendations:  Not assessed at this time Information / Referral to community resources:  Other (Comment Required) (ALF/[facility] )  Patient/Family's Response to care:  Patient is agreeable to ALF and [facility] placement.   Patient/Family's Understanding of and Emotional Response to Diagnosis, Current Treatment, and Prognosis: Patient stated that she understands her diagnosis, treatment and prognosis.    Emotional  Assessment Appearance:  Appears stated age Attitude/Demeanor/Rapport:  Guarded Affect (typically observed):  Flat Orientation:  Oriented to Self, Oriented to Place, Oriented to  Time, Oriented to Situation Alcohol / Substance use:  Not Applicable Psych involvement (Current and /or in the community):  No (Comment)  Discharge Needs  Concerns to be addressed:  Discharge Planning Concerns Readmission within the last 30 days:  No Current discharge risk:  Homeless Barriers to Discharge:  Homeless with medical needs   Ihor Gully, LCSW 06/11/2016, 1:40 PM

## 2016-06-11 NOTE — NC FL2 (Signed)
  [place] LEVEL OF CARE SCREENING TOOL     IDENTIFICATION  Patient Name: Suhaylah Wampole Birthdate: Apr 01, 1965 Sex: female Admission Date (Current Location): 06/11/2016  [facility] and [place] Number:  [employer] and Address:  [place] [address] [place]      Provider Number: [id number]  Attending Physician Name and Address:  Koleen Nimrod Acost*  Relative Name and Phone Number:       Current Level of Care: Hospital Recommended Level of Care: [place], [facility] Prior Approval Number:    Date Approved/Denied:   PASRR Number: [id number]  Discharge Plan: Home    Current Diagnoses: Patient Active Problem List   Diagnosis Date Noted  . Sepsis ([place]) 06/11/2016  . Endometrial cancer ([place]) 06/11/2016  . Pyometrium 06/11/2016  . Leukocytosis 06/11/2016    Orientation RESPIRATION BLADDER Height & Weight     Self, Time, Situation, Place  Normal Continent Weight: 151 lb 4.8 oz (68.6 kg) Height:  '5\' 3"'$  (160 cm)  BEHAVIORAL SYMPTOMS/MOOD NEUROLOGICAL BOWEL NUTRITION STATUS      Continent Diet (Heart Healthy)  AMBULATORY STATUS COMMUNICATION OF NEEDS Skin   Independent Verbally Normal                       Personal Care Assistance Level of Assistance  Bathing, Feeding, Dressing Bathing Assistance: Independent Feeding assistance: Independent Dressing Assistance: Independent     Functional Limitations Info  Sight, Hearing, Speech Sight Info: Adequate Hearing Info: Adequate Speech Info: Adequate    SPECIAL CARE FACTORS FREQUENCY                       Contractures      Additional Factors Info  Code Status Code Status Info: Full Code             Current Medications (06/11/2016):  This is the current hospital active medication list Current Facility-Administered Medications  Medication Dose Route Frequency Provider Last Rate Last Dose  . 0.9 %  sodium chloride  infusion   Intravenous Continuous Erline Hau, MD 75 mL/hr at 06/11/16 1237    . acetaminophen (TYLENOL) tablet 650 mg  650 mg Oral Q6H PRN Erline Hau, MD       Or  . acetaminophen (TYLENOL) suppository 650 mg  650 mg Rectal Q6H PRN Erline Hau, MD      . metroNIDAZOLE (FLAGYL) IVPB 500 mg  500 mg Intravenous Q8H Estela Leonie Green, MD      . morphine 2 MG/ML injection 1 mg  1 mg Intravenous Q4H PRN Erline Hau, MD   1 mg at 06/11/16 1223  . ondansetron (ZOFRAN) tablet 4 mg  4 mg Oral Q6H PRN Erline Hau, MD       Or  . ondansetron [facility]) injection 4 mg  4 mg Intravenous Q6H PRN Erline Hau, MD      . senna-docusate (Senokot-S) tablet 1 tablet  1 tablet Oral QHS PRN Erline Hau, MD         Discharge Medications: Please see discharge summary for a list of discharge medications.  Relevant Imaging Results:  Relevant Lab Results:   Additional Information SSN [id number] Clydene Pugh, LCSW

## 2016-06-11 NOTE — ED Notes (Signed)
Dr Glo Herring in to attempt vagina exam.  Exam held off d/t pts pain.  MD explain to pt that this procedure need to be done in OR.

## 2016-06-11 NOTE — Consult Note (Signed)
[facility] Consultation Oncology  Name: Valerie Gonzales      MRN: [id number]    Location: A309/A309-01  Date: 06/11/2016 Time:4:47 PM   REFERRING PHYSICIAN:  Domingo Mend, MD ([facility])  REASON FOR CONSULT:  Metastatic endometrial cancer, needs treatment plan and follow-up   DIAGNOSIS:  Malignant mixed Mullerian Tumor, aka Carcinosarcoma  HISTORY OF PRESENT ILLNESS:  51 yo black American with a history significant for bipolar disease who presents to the [facility] ED with abdominal pain and nausea and vomiting.    Malignant mixed Mullerian tumor (MMMT) ([place])   05/19/2016 Procedure    Endometrial biopsy by Dr. Evie Lacks      05/22/2016 Pathology Results    Malignant mixed Mullerian Tumor (MMMT) -Carcinosarcoma.  The morphologic features, in conjunction with the immunohistochemical findings, support the diagnosis of MMMT with heterologous stromal component (rhabdomyosarcoma).      06/01/2016 Imaging    CT CAP- CT CHEST: Multiple old pulmonary metastasis without acute cardiopulmonary process.  Possible breast masses, suspicious for metastatic disease.  CT ABDOMEN AND PELVIS: Enlarged uterus with distended endometrium, increased from prior CT consistent with progression of patient's known primary endometrial carcinoma. Retroperitoneal/iliac chain lymphadenopathy consistent with metastatic disease.  New mild L5 pathologic fracture.       06/11/2016 -  Hospital Admission    Admit date: 06/11/2016 Admission diagnosis: Abdominal pain, nausea/vomiting Additional comments:       Ascertaining her oncology history is difficult from the patient.  From what I can gather, she underwent an endometrial biopsy at [facility] that demonstrated a malignant mixed mullerian tumor (MMMT).  She was subsequently referred to [place] [facility], and her appointment was on 05/28/2016.  This was arranged in conjunction with [facility] at time of discharge.  The  patient was a no-show for this consultation.  She now presents to the hospital with abdominal pain, nausea, and vomiting.  Oncology is consulted for development of treatment plan.  Patient reports significant abdominal pain.  She notes that it comes and goes.  She denies any vaginal bleeding at this time, but it is noted that she is having vaginal discharge.  Her hemoglobin has dropped since her hospitalization.  She is scheduled for a D&C by Dr. Glo Herring tomorrow.  She reports that her pain is poorly controlled at this time.  We will review her pain medication regimen.  She admits that she is homeless.  Hospital social work  have provided her with names and telephone numbers of shelters that she may qualify for.  She is unsure at this time where she will live.  She reports a significant weight loss of approximately 60 pounds over the last 3-4 months.  Review of Systems  Constitutional: Positive for weight loss. Negative for chills and fever.  HENT: Negative.   Eyes: Negative.   Respiratory: Negative.  Negative for cough.   Cardiovascular: Negative.  Negative for chest pain.  Gastrointestinal: Positive for abdominal pain (low abdominal/pelvic pain). Negative for blood in stool, constipation, diarrhea, melena, nausea and vomiting.  Genitourinary:       Pelvic pain.  Vaginal discharge  Musculoskeletal: Negative.   Skin: Negative.   Neurological: Negative.  Negative for weakness.  Endo/Heme/Allergies: Negative.   Psychiatric/Behavioral: Negative.      PAST MEDICAL HISTORY:   Past Medical History:  Diagnosis Date  . Bipolar 1 disorder ([place])   . Blood transfusion without reported diagnosis   . Cancer ([place])   . Hypertension   .  Lung cancer ([place])   . Malignant mixed Mullerian tumor (MMMT) ([place]) 06/11/2016  . Uterine cancer ([place])     ALLERGIES: No Known Allergies    MEDICATIONS: I have reviewed the patient's current medications.     PAST SURGICAL HISTORY Past Surgical History:    Procedure Laterality Date  . CESAREAN SECTION    . CESAREAN SECTION    . DILATION AND CURETTAGE OF UTERUS    . FRACTURE SURGERY    . MANDIBLE FRACTURE SURGERY      FAMILY HISTORY: History reviewed. No pertinent family history.  She is adopted.  She does have children but she does not remain in contact with them.  SOCIAL HISTORY:  reports that she has been smoking Cigarettes.  She has been smoking about 0.50 packs per day. She has never used smokeless tobacco. She reports that she does not drink alcohol or use drugs.  She admits to a history of crack/cocaine use.  She last used in 2014.  She is adopted.  She reports being agnostic.  She admits to ascertaining disability secondary to mental illness.  PERFORMANCE STATUS: The patient's performance status is 1 - Symptomatic but completely ambulatory  PHYSICAL EXAM: Most Recent Vital Signs: Blood pressure 137/71, pulse 87, temperature 98.6 F (37 C), temperature source Oral, resp. rate 18, height 5' 3"  (1.6 m), weight 151 lb 4.8 oz (68.6 kg), SpO2 96 %. General appearance: alert, cooperative, appears stated age and moderate distress Head: Normocephalic, without obvious abnormality, atraumatic Eyes: conjunctivae/corneas clear. PERRL, EOM's intact. Fundi benign. Back: symmetric, no curvature. ROM normal. No CVA tenderness. Pelvic: discomfort on palpation Extremities: extremities normal, atraumatic, no cyanosis or edema Skin: Skin color, texture, turgor normal. No rashes or lesions Neurologic: Grossly normal  LABORATORY DATA:  Results for orders placed or performed during the hospital encounter of 06/11/16 (from the past 48 hour(s))  CBC with Differential/Platelet     Status: Abnormal   Collection Time: 06/11/16  4:50 AM  Result Value Ref Range   WBC 32.2 (H) 4.0 - 10.5 K/uL   RBC 4.09 3.87 - 5.11 MIL/uL   Hemoglobin 10.7 (L) 12.0 - 15.0 g/dL   HCT 33.1 (L) 36.0 - 46.0 %   MCV 80.9 78.0 - 100.0 fL   MCH 26.2 26.0 - 34.0 pg   MCHC  32.3 30.0 - 36.0 g/dL   RDW 16.3 (H) 11.5 - 15.5 %   Platelets 654 (H) 150 - 400 K/uL   Neutrophils Relative % 91 %   Neutro Abs 29.2 (H) 1.7 - 7.7 K/uL   Lymphocytes Relative 5 %   Lymphs Abs 1.7 0.7 - 4.0 K/uL   Monocytes Relative 4 %   Monocytes Absolute 1.3 (H) 0.1 - 1.0 K/uL   Eosinophils Relative 0 %   Eosinophils Absolute 0.0 0.0 - 0.7 K/uL   Basophils Relative 0 %   Basophils Absolute 0.0 0.0 - 0.1 K/uL  Comprehensive metabolic panel     Status: Abnormal   Collection Time: 06/11/16  4:50 AM  Result Value Ref Range   Sodium 133 (L) 135 - 145 mmol/L   Potassium 4.1 3.5 - 5.1 mmol/L   Chloride 98 (L) 101 - 111 mmol/L   CO2 24 22 - 32 mmol/L   Glucose, Bld 101 (H) 65 - 99 mg/dL   BUN 7 6 - 20 mg/dL   Creatinine, Ser 0.49 0.44 - 1.00 mg/dL   Calcium 8.9 8.9 - 10.3 mg/dL   Total Protein 7.4 6.5 - 8.1 g/dL  Albumin 2.5 (L) 3.5 - 5.0 g/dL   AST 13 (L) 15 - 41 U/L   ALT 6 (L) 14 - 54 U/L   Alkaline Phosphatase 94 38 - 126 U/L   Total Bilirubin 0.5 0.3 - 1.2 mg/dL   GFR calc non Af Amer >60 >60 mL/min   GFR calc Af Amer >60 >60 mL/min    Comment: (NOTE) The eGFR has been calculated using the CKD EPI equation. This calculation has not been validated in all clinical situations. eGFR's persistently <60 mL/min signify possible Chronic Kidney Disease.    Anion gap 11 5 - 15  Lipase, blood     Status: None   Collection Time: 06/11/16  4:50 AM  Result Value Ref Range   Lipase 12 11 - 51 U/L  Reticulocytes     Status: None   Collection Time: 06/11/16  4:50 AM  Result Value Ref Range   Retic Ct Pct 1.1 0.4 - 3.1 %   RBC. 4.10 3.87 - 5.11 MIL/uL   Retic Count, Manual 45.1 19.0 - 186.0 K/uL  I-Stat Beta hCG blood, ED ([facility], [facility], [facility] only)     Status: Abnormal   Collection Time: 06/11/16  5:11 AM  Result Value Ref Range   I-stat hCG, quantitative 6.7 (H) <5 mIU/mL   Comment 3            Comment:   GEST. AGE      CONC.  (mIU/mL)   <=1 WEEK        5 - 50     2 WEEKS       50 -  500     3 WEEKS       100 - 10,000     4 WEEKS     1,000 - 30,000        FEMALE AND NON-PREGNANT FEMALE:     LESS THAN 5 mIU/mL   Urinalysis, Routine w reflex microscopic     Status: Abnormal   Collection Time: 06/11/16  6:15 AM  Result Value Ref Range   Color, Urine AMBER (A) YELLOW    Comment: BIOCHEMICALS MAY BE AFFECTED BY COLOR   APPearance HAZY (A) CLEAR   Specific Gravity, Urine 1.045 (H) 1.005 - 1.030   pH 5.0 5.0 - 8.0   Glucose, UA NEGATIVE NEGATIVE mg/dL   Hgb urine dipstick NEGATIVE NEGATIVE   Bilirubin Urine MODERATE (A) NEGATIVE   Ketones, ur 5 (A) NEGATIVE mg/dL   Protein, ur 30 (A) NEGATIVE mg/dL   Nitrite NEGATIVE NEGATIVE   Leukocytes, UA LARGE (A) NEGATIVE   RBC / HPF 0-5 0 - 5 RBC/hpf   WBC, UA TOO NUMEROUS TO COUNT 0 - 5 WBC/hpf   Bacteria, UA RARE (A) NONE SEEN   Squamous Epithelial / LPF 0-5 (A) NONE SEEN   Mucous PRESENT   Lactic acid, plasma     Status: Abnormal   Collection Time: 06/11/16 12:15 PM  Result Value Ref Range   Lactic Acid, Venous 2.5 (HH) 0.5 - 1.9 mmol/L    Comment: CRITICAL RESULT CALLED TO, READ BACK BY AND VERIFIED WITH: BIVENS L. AT 1342 ON [id number] BY THOMPSON S.   Culture, blood (routine x 2)     Status: None (Preliminary result)   Collection Time: 06/11/16 12:15 PM  Result Value Ref Range   Specimen Description BLOOD RIGHT HAND    Special Requests      BOTTLES DRAWN AEROBIC AND ANAEROBIC Blood Culture adequate volume   Culture PENDING  Report Status PENDING       RADIOGRAPHY: Dg Abd Acute W/chest  Result Date: 06/11/2016 CLINICAL DATA:  Nausea vomiting and diarrhea for several months. Chest pain on tonight. EXAM: DG ABDOMEN ACUTE W/ 1V CHEST COMPARISON:  05/29/2016 FINDINGS: Multiple pulmonary nodules persist, probably unchanged. No airspace consolidation. No effusion. The abdominal gas pattern is normal.  No biliary or urinary calculi. IMPRESSION: Pulmonary metastases.  No consolidation or effusion. Normal abdominal gas  pattern. Electronically Signed   By: Andreas Newport M.D.   On: 06/11/2016 06:16       PATHOLOGY:       ASSESSMENT/PLAN:   51 year old with not only a very complicated and serious oncology case, but also a complicated socioeconomic situation.  She is homeless.  She had an appt at [facility] on 05/28/2016 for consultation, but she was a no-show.  Compliance moving forward is therefore a concern.  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  She may have only metastatic MMMT, Stage IV MMMT + Stage IV Breast cancer, or Stage IV MMMT and early breast cancer.  Either way, she has an incurable/terminal disease.  Based upon the natural history of MMMT, she is educated on the aggressiveness and incurability of this disease.  Treatment is chemotherapy-based.  There are a number of chemotherapeutic-regimens available for this malignancy, but generally speaking, chemotherapy is Carboplatin/Paclitaxel based.  If response is noted, this would prolong overall survival by a few months.  She would need a port-a-cath.  Prior to treatment, she would need a diagnostic mammogram with biopsy.  Ideally, a metastatic lesion, such as bone lesion, would help determine which disease is metastatic and could help prioritize disease treatment or alter treatment choices.  Technically, carboplatin/paclitaxel would cover both disease process (if there is a second primary malignancy-breast).  This malignancy is further complicated by her homelessness and her ability to get necessary care and aggressive follow-up which is required for chemotherapy treatment and management of port-a-cath.    Given this terminal diagnosis, goals of care, comfort care, and Hospice were broached.  Will consult palliative medicine to help progress these conversations.  Normocytic, normochromic anemia is noted, secondary to GU  blood loss versus dilutional.  Anemia panel is ordered.  CA 125 is ordered.  Pelvic pain, not optimally controlled at this time.  Morphine is ordered.  I have changed the frequency to every 2 hours PRN.  Patient has not taken her morphine in 4+ hours.  She is reminded that she has to ask for it as it is written PRN.  Nursing is notified and they will administer 1 mg now.  At this time, the patient would like to consider her options:  1. IV chemotherapy  2. Comfort/Hospice care Either choice would be reasonable.  Oncology will follow from the periphery.  We will see her as an outpatient.  At time of discharge, please notify oncology so we can schedule her follow-up accordingly (PRIOR TO HER DISCHARGE) as to avoid any inability to contact the patient given her homelessness.  Pager: [phone]   All questions were answered. The patient knows to call the clinic with any problems, questions or concerns. We can certainly see the patient much sooner if necessary.  Patient and plan discussed with Dr. Twana First and she is in agreement with the aforementioned.   Robynn Pane, PA-C 06/11/2016 4:47 PM

## 2016-06-11 NOTE — ED Notes (Signed)
Refusing blood draws.  Dr Betsey Holiday informed

## 2016-06-12 ENCOUNTER — Inpatient Hospital Stay (HOSPITAL_COMMUNITY): Payer: Medicare Other | Admitting: Anesthesiology

## 2016-06-12 ENCOUNTER — Encounter (HOSPITAL_COMMUNITY)
Admission: EM | Disposition: A | Discharge status: Home / Self Care | Payer: Self-pay | Source: Home / Self Care | Attending: Internal Medicine

## 2016-06-12 ENCOUNTER — Encounter (HOSPITAL_COMMUNITY): Payer: Self-pay | Admitting: *Deleted

## 2016-06-12 DIAGNOSIS — C541 Malignant neoplasm of endometrium: Secondary | ICD-10-CM | POA: Diagnosis not present

## 2016-06-12 DIAGNOSIS — C549 Malignant neoplasm of corpus uteri, unspecified: Secondary | ICD-10-CM

## 2016-06-12 DIAGNOSIS — N719 Inflammatory disease of uterus, unspecified: Secondary | ICD-10-CM | POA: Diagnosis not present

## 2016-06-12 HISTORY — PX: DILATION AND CURETTAGE OF UTERUS: SHX78

## 2016-06-12 LAB — CBC
HEMATOCRIT: 27.1 % — AB (ref 36.0–46.0)
HEMOGLOBIN: 8.7 g/dL — AB (ref 12.0–15.0)
MCH: 26 pg (ref 26.0–34.0)
MCHC: 32.1 g/dL (ref 30.0–36.0)
MCV: 81.1 fL (ref 78.0–100.0)
Platelets: 594 10*3/uL — ABNORMAL HIGH (ref 150–400)
RBC: 3.34 MIL/uL — ABNORMAL LOW (ref 3.87–5.11)
RDW: 16.5 % — AB (ref 11.5–15.5)
WBC: 29.1 10*3/uL — AB (ref 4.0–10.5)

## 2016-06-12 LAB — FOLATE: Folate: 4.6 ng/mL — ABNORMAL LOW (ref >5.9)

## 2016-06-12 LAB — IRON AND TIBC
IRON: 17 ug/dL — AB (ref 28–170)
SATURATION RATIOS: 11 % (ref 10.4–31.8)
TIBC: 150 ug/dL — AB (ref 250–450)
UIBC: 133 ug/dL

## 2016-06-12 LAB — FERRITIN: Ferritin: 759 ng/mL — ABNORMAL HIGH (ref 11–307)

## 2016-06-12 LAB — BASIC METABOLIC PANEL
ANION GAP: 10 (ref 5–15)
BUN: 9 mg/dL (ref 6–20)
CHLORIDE: 99 mmol/L — AB (ref 101–111)
CO2: 24 mmol/L (ref 22–32)
Calcium: 8.4 mg/dL — ABNORMAL LOW (ref 8.9–10.3)
Creatinine, Ser: 0.44 mg/dL (ref 0.44–1.00)
GFR calc Af Amer: >60 mL/min (ref >60)
Glucose, Bld: 85 mg/dL (ref 65–99)
POTASSIUM: 3.8 mmol/L (ref 3.5–5.1)
SODIUM: 133 mmol/L — AB (ref 135–145)

## 2016-06-12 LAB — MRSA PCR SCREENING: MRSA by PCR: NEGATIVE

## 2016-06-12 LAB — VITAMIN B12: VITAMIN B 12: 365 pg/mL (ref 180–914)

## 2016-06-12 LAB — URINE CULTURE: CULTURE: NO GROWTH

## 2016-06-12 LAB — PREPARE RBC (CROSSMATCH)

## 2016-06-12 LAB — LACTIC ACID, PLASMA: Lactic Acid, Venous: 1.6 mmol/L (ref 0.5–1.9)

## 2016-06-12 LAB — ABO/RH: ABO/RH(D): O POS

## 2016-06-12 SURGERY — DILATION AND CURETTAGE
Anesthesia: General | Site: Vagina

## 2016-06-12 MED ORDER — MIDAZOLAM HCL 2 MG/2ML IJ SOLN
1.0000 mg | INTRAMUSCULAR | Status: DC
  Administered 2016-06-12: 2 mg via INTRAVENOUS

## 2016-06-12 MED ORDER — PIPERACILLIN-TAZOBACTAM 3.375 G IVPB
3.3750 g | Freq: Three times a day (TID) | INTRAVENOUS | Status: DC
  Administered 2016-06-12 – 2016-06-15 (×9): 3.375 g via INTRAVENOUS
  Filled 2016-06-12 (×9): qty 50

## 2016-06-12 MED ORDER — 0.9 % SODIUM CHLORIDE (POUR BTL) OPTIME
TOPICAL | Status: DC | PRN
  Administered 2016-06-12: 1000 mL

## 2016-06-12 MED ORDER — ENOXAPARIN SODIUM 40 MG/0.4ML ~~LOC~~ SOLN
40.0000 mg | SUBCUTANEOUS | Status: DC
  Filled 2016-06-12 (×4): qty 0.4

## 2016-06-12 MED ORDER — MIDAZOLAM HCL 2 MG/2ML IJ SOLN
INTRAMUSCULAR | Status: AC
  Filled 2016-06-12: qty 2

## 2016-06-12 MED ORDER — FENTANYL CITRATE (PF) 100 MCG/2ML IJ SOLN
INTRAMUSCULAR | Status: AC
  Filled 2016-06-12: qty 2

## 2016-06-12 MED ORDER — FENTANYL CITRATE (PF) 100 MCG/2ML IJ SOLN
INTRAMUSCULAR | Status: DC | PRN
  Administered 2016-06-12: 50 ug via INTRAVENOUS

## 2016-06-12 MED ORDER — HYDROMORPHONE HCL 1 MG/ML IJ SOLN
INTRAMUSCULAR | Status: AC
  Filled 2016-06-12: qty 1

## 2016-06-12 MED ORDER — LIDOCAINE HCL (CARDIAC) 10 MG/ML IV SOLN
INTRAVENOUS | Status: DC | PRN
  Administered 2016-06-12 (×2): 50 mg via INTRAVENOUS

## 2016-06-12 MED ORDER — ONDANSETRON HCL 4 MG/2ML IJ SOLN
INTRAMUSCULAR | Status: AC
  Filled 2016-06-12: qty 2

## 2016-06-12 MED ORDER — HYDROMORPHONE HCL 1 MG/ML IJ SOLN: 0.2500 mg | INTRAMUSCULAR | Status: DC | PRN

## 2016-06-12 MED ORDER — METHYLERGONOVINE MALEATE 0.2 MG/ML IJ SOLN
INTRAMUSCULAR | Status: AC
  Filled 2016-06-12: qty 1

## 2016-06-12 MED ORDER — HYDROMORPHONE HCL 1 MG/ML IJ SOLN
0.2500 mg | INTRAMUSCULAR | Status: DC | PRN
  Administered 2016-06-12 (×2): 0.5 mg via INTRAVENOUS

## 2016-06-12 MED ORDER — ONDANSETRON HCL 4 MG/2ML IJ SOLN
4.0000 mg | Freq: Once | INTRAMUSCULAR | Status: AC
  Administered 2016-06-12: 4 mg via INTRAVENOUS

## 2016-06-12 MED ORDER — PROPOFOL 10 MG/ML IV BOLUS
INTRAVENOUS | Status: DC | PRN
  Administered 2016-06-12: 150 mg via INTRAVENOUS
  Administered 2016-06-12: 20 mg via INTRAVENOUS

## 2016-06-12 MED ORDER — SODIUM CHLORIDE 0.9 % IV SOLN
Freq: Once | INTRAVENOUS | Status: AC
  Administered 2016-06-12: 10:00:00 via INTRAVENOUS

## 2016-06-12 SURGICAL SUPPLY — 22 items
BAG HAMPER (MISCELLANEOUS) ×3 IMPLANT
CATH ROBINSON RED A/P 16FR (CATHETERS) ×3 IMPLANT
CLOTH BEACON ORANGE TIMEOUT ST (SAFETY) ×3 IMPLANT
COVER LIGHT HANDLE STERIS (MISCELLANEOUS) ×6 IMPLANT
COVER MAYO STAND XLG (DRAPE) ×3 IMPLANT
CURETTE VACUUM 9MM CVD CLR (CANNULA) ×3 IMPLANT
FORMALIN 10 PREFIL 480ML (MISCELLANEOUS) ×3 IMPLANT
GLOVE BIOGEL PI IND STRL 6.5 (GLOVE) ×1 IMPLANT
GLOVE BIOGEL PI IND STRL 9 (GLOVE) ×1 IMPLANT
GLOVE BIOGEL PI INDICATOR 6.5 (GLOVE) ×2
GLOVE BIOGEL PI INDICATOR 9 (GLOVE) ×2
GLOVE ECLIPSE 9.0 STRL (GLOVE) ×3 IMPLANT
GLOVE INDICATOR 6.5 STRL GRN (GLOVE) ×3 IMPLANT
GOWN SPEC L3 XXLG W/TWL (GOWN DISPOSABLE) ×3 IMPLANT
GOWN STRL REUS W/TWL LRG LVL3 (GOWN DISPOSABLE) ×3 IMPLANT
KIT ROOM TURNOVER AP CYSTO (KITS) ×3 IMPLANT
MARKER SKIN DUAL TIP RULER LAB (MISCELLANEOUS) ×3 IMPLANT
NS IRRIG 1000ML POUR BTL (IV SOLUTION) ×3 IMPLANT
PACK BASIC III (CUSTOM PROCEDURE TRAY) ×2
PACK SRG BSC III STRL LF ECLPS (CUSTOM PROCEDURE TRAY) ×1 IMPLANT
PAD ARMBOARD 7.5X6 YLW CONV (MISCELLANEOUS) ×3 IMPLANT
SET BERKELEY SUCTION TUBING (SUCTIONS) ×3 IMPLANT

## 2016-06-12 NOTE — Op Note (Signed)
Please see the brief operative note for surgical details 

## 2016-06-12 NOTE — Progress Notes (Signed)
Initial visit with Valerie Gonzales for support. She was very expressive during the 45 minutes. Her life review shared that she only knew her adoptive parents though she stated she'd wanted to know her biological parents. She shared she had a traumatic event in 2015 which she didn't share details of. She shared she is still recovering emotionally. She shared she is still trying to process her recent cancer diagnosis yet she realized she felt separate from that reality at some level. Yet she does seem to be processing through this. Will continue to support her.

## 2016-06-12 NOTE — Transfer of Care (Signed)
Immediate Anesthesia Transfer of Care Note  Patient: Valerie Gonzales  Procedure(s) Performed: Procedure(s): SUCTION DILATATION AND CURETTAGE (N/A)  Patient Location: PACU  Anesthesia Type:General  Level of Consciousness: alert  and patient cooperative  Airway & Oxygen Therapy: Patient Spontanous Breathing  Post-op Assessment: Report given to RN and Post -op Vital signs reviewed and stable  Post vital signs: Reviewed and stable  Last Vitals:  Vitals:   06/12/16 1230 06/12/16 1234  BP:  (!) 174/94  Pulse:    Resp: (!) 22 (!) 21  Temp:      Last Pain:  Vitals:   06/12/16 1150  TempSrc: Oral  PainSc: 4       Patients Stated Pain Goal: 5 (41/28/78 6767)  Complications: No apparent anesthesia complications

## 2016-06-12 NOTE — Progress Notes (Signed)
Hospital day 2.   Subjective: Patient reports that pain much better controlled, and questions answered re: surgery. Pt had endometrial biopsy done at [facility] earlier. .    Objective: I have reviewed patient's vital signs, medications and labs. CBC Latest Ref Rng & Units 06/12/2016 06/11/2016 06/01/2016  WBC 4.0 - 10.5 K/uL 29.1(H) 32.2(H) 25.0(H)  Hemoglobin 12.0 - 15.0 g/dL 8.7(L) 10.7(L) 9.1(L)  Hematocrit 36.0 - 46.0 % 27.1(L) 33.1(L) 28.4(L)  Platelets 150 - 400 K/uL 594(H) 654(H) 535(H)  BP (!) 146/80 (BP Location: Right Arm)   Pulse 77   Temp 98.1 F (36.7 C) (Oral)   Resp 18   Ht '5\' 3"'$  (1.6 m)   Wt 151 lb 4.8 oz (68.6 kg)   SpO2 98%   BMI 26.80 kg/m    Physical Examination: General appearance - alert, well appearing, and in no distress, oriented to person, place, and time and normal appearing weight Mental status - alert, oriented to person, place, and time, normal mood, behavior, speech, dress, motor activity, and thought processes Abdomen - less tender. Uterus at U+ 2 Still with malodorous d/c   Assessment/Plan: MMMT, with pyometria Anemia, worsened by hydration Plan:  will transfuse 2 units today , suction d&C midday.  LOS: 1 day    Calil Amor V 06/12/2016, 8:23 AM

## 2016-06-12 NOTE — Plan of Care (Signed)
Problem: Pain Managment: Goal: General experience of comfort will improve Outcome: Not Progressing Pt continues to have 6/10 pain. Complains of pain in mostly back. Pt receiving Morphine q2h PRN, given once so far this shift. Pt states pain medication Tylenol and Morphine together seems to help her more. Pts VSS and she is resting quietly. Will continue to monitor pt

## 2016-06-12 NOTE — Progress Notes (Signed)
Attempted to have informed consent form signed, pt is refusing to sign until AM.

## 2016-06-12 NOTE — Anesthesia Procedure Notes (Signed)
Procedure Name: LMA Insertion Date/Time: 06/12/2016 12:49 PM Performed by: Vista Deck Pre-anesthesia Checklist: Patient identified, Patient being monitored, Emergency Drugs available, Timeout performed and Suction available Patient Re-evaluated:Patient Re-evaluated prior to inductionOxygen Delivery Method: Circle System Utilized Preoxygenation: Pre-oxygenation with 100% oxygen Intubation Type: IV induction Ventilation: Mask ventilation without difficulty LMA: LMA inserted LMA Size: 4.0 Number of attempts: 1 Placement Confirmation: positive ETCO2 and breath sounds checked- equal and bilateral Tube secured with: Tape Dental Injury: Teeth and Oropharynx as per pre-operative assessment

## 2016-06-12 NOTE — Brief Op Note (Signed)
06/11/2016 - 06/12/2016  1:37 PM  PATIENT:  Valerie Gonzales  51 y.o. female  PRE-OPERATIVE DIAGNOSIS:  pyometra, endometrial cancer (MMMT)  POST-OPERATIVE DIAGNOSIS:  pyometra, endometrial cancer(MMMT)  PROCEDURE:  Procedure(s): SUCTION DILATATION AND CURETTAGE (N/A) , uterine irrigation  SURGEON:  Surgeon(s) and Role:    * Jonnie Kind, MD - Primary  PHYSICIAN ASSISTANT:   ASSISTANTS: none   ANESTHESIA:   general  EBL:  Total I/O In: 812.5 [I.V.:550; Blood:262.5] Out: 20 [Blood:20]  BLOOD ADMINISTERED:2 units preop CC PRBC  DRAINS: none   LOCAL MEDICATIONS USED:  NONE  SPECIMEN:  Source of Specimen:  endometrial tissue (cancer)from lower uterine segment to reduce tamponade  DISPOSITION OF SPECIMEN:  PATHOLOGY  COUNTS:  YES  TOURNIQUET:  * No tourniquets in log *  DICTATION: .Dragon Dictation  PLAN OF CARE: has inpatient admit  PATIENT DISPOSITION:  PACU - hemodynamically stable.   Delay start of Pharmacological VTE agent (>24hrs) due to surgical blood loss or risk of bleeding: not applicable  DETAILS of procedure: Patient was taken operating room prepped and draped with general anesthesia induction, timeout conducted by surgical team, and no additional antibiotics administered as patient was under active active antibiotic therapy with Zosyn and Flagyl. Speculum was inserted, cervix could be identified and was seen to be open and showed firm hard indurated appearing tissue in the lower uterine segment and endocervix consistent with tumor Ring forceps were placed on the anterior lip of the cervix it was noted that there was tumor in the cervix itself at 3:00 but it was not bleeding at this time the tissue in the lower uterine segment could be false by use of ring forceps and sufficient tissue was removed to allow dilation beginning with a 27 [place] sound and going to 4 [place]. There was additional tumor in the lower uterine segment. The suctioning was then begun  with a 9 mm suction curet was able to be slid past the tumor mass and extracted a malodorous fluid fluid consistent with with abscess, removing 300 cc of purulent material circumferential suction curettage did not remove significant tissue fragments. A Robinson catheter could be inserted into the fundus of the uterus and it was irrigated out with her couple 100 cc of saline and suction curettage again performed to remove any residual fluid. Ring forceps were again used to physically A Leontine Locket and debulk some of the tissue obstructing the lower uterine segment extending for a distance of about 6-8 cm of the lower uterine segment. Done the sound could be passed into the endometrial cavity easier which should reduce the likelihood of recurrent tamponade, at least for a brief time. Specimen was passed off probably 10 cc-20cc of tissue and patient to recovery room in stable condition

## 2016-06-12 NOTE — Progress Notes (Signed)
Palliative Medicine RN Note: PMT provider is not available today at [facility] PMT NP will return Monday, May 7, and patient will be seen then. If recommendations are needed in the interim, please contact the PMT office at [phone] between the hours of 8am and 5pm.  Lynzi Meulemans G. Burl Tauzin, RN, BSN, [facility] 06/12/2016 7:50 AM Cell [phone] 8:00-4:00 Monday-Friday Office [phone]

## 2016-06-12 NOTE — Anesthesia Postprocedure Evaluation (Signed)
Anesthesia Post Note  Patient: Valerie Gonzales  Procedure(s) Performed: Procedure(s) (LRB): SUCTION DILATATION AND CURETTAGE (N/A)  Patient location during evaluation: PACU Anesthesia Type: General Level of consciousness: awake Pain management: satisfactory to patient Vital Signs Assessment: post-procedure vital signs reviewed and stable Respiratory status: spontaneous breathing and patient connected to nasal cannula oxygen Cardiovascular status: stable Anesthetic complications: no     Last Vitals:  Vitals:   06/12/16 1345 06/12/16 1400  BP: (!) 150/104 (!) 182/81  Pulse: 81 80  Resp: (!) 26 19  Temp:      Last Pain:  Vitals:   06/12/16 1150  TempSrc: Oral  PainSc: 4                  Macall Mccroskey

## 2016-06-12 NOTE — Progress Notes (Signed)
PROGRESS NOTE    Valerie Gonzales  KDX:[id number] DOB: [birth year] DOA: 06/11/2016 PCP: No PCP Per Patient     Brief Narrative:  51 y/o woman admitted from home on 5/3 due to abd pain, n/v. Has been diagnosed with metastatic endometrial cancer and pyometrium.   Assessment & Plan:   Principal Problem:   Malignant mixed Mullerian tumor (MMMT) (HCC) Active Problems:   Sepsis ([place])   Pyometrium   Leukocytosis   Pyometrium -S/p D and C today with Dr. Glo Herring. -Continue zosyn and flagyl.  Mixed Mullerian Tumor -Seen by onc. -Has follow up in the office on May 10 at 8 am to determine treatment plan.  Leukocytosis -Due to above. -Improved with abx therapy.  Social Issues -Mainly homelessness. -SW is looking into securing placement for her upon DC.   DVT prophylaxis: loevnox Code Status: full code Family Communication: patient only Disposition Plan: home once medically improved and placement securedr  Consultants:   GYN  Procedures:   D and C  Antimicrobials:  Anti-infectives    Start     Dose/Rate Route Frequency Ordered Stop   06/12/16 0815  piperacillin-tazobactam (ZOSYN) IVPB 3.375 g     3.375 g 12.5 mL/hr over 240 Minutes Intravenous Every 8 hours 06/12/16 0803     06/11/16 1215  metroNIDAZOLE (FLAGYL) IVPB 500 mg     500 mg 100 mL/hr over 60 Minutes Intravenous Every 8 hours 06/11/16 1202     06/11/16 0745  piperacillin-tazobactam (ZOSYN) IVPB 3.375 g  Status:  Discontinued     3.375 g 100 mL/hr over 30 Minutes Intravenous  Once 06/11/16 0738 06/12/16 0758   06/11/16 0745  vancomycin (VANCOCIN) 1,250 mg in sodium chloride 0.9 % 250 mL IVPB  Status:  Discontinued     1,250 mg 166.7 mL/hr over 90 Minutes Intravenous  Once 06/11/16 0738 06/12/16 0858       Subjective: Still with pain, altho improved (seen before D and C)  Objective: Vitals:   06/12/16 1400 06/12/16 1415 06/12/16 1431 06/12/16 1530  BP: (!) 182/81 (!) 164/84 (!) 178/78 (!)  153/92  Pulse: 80 76 75 71  Resp: '19 17  16  '$ Temp:      TempSrc:      SpO2: 92% 99% 100% 97%  Weight:      Height:        Intake/Output Summary (Last 24 hours) at 06/12/16 1829 Last data filed at 06/12/16 1603  Gross per 24 hour  Intake             1805 ml  Output              395 ml  Net             1410 ml   Filed Weights   06/11/16 0410 06/11/16 1040 06/11/16 1100  Weight: 67.1 kg (148 lb) 67.1 kg (148 lb) 68.6 kg (151 lb 4.8 oz)    Examination:  General exam: Alert, awake, oriented x 3 Respiratory system: Clear to auscultation. Respiratory effort normal. Cardiovascular system:RRR. No murmurs, rubs, gallops. Gastrointestinal system: Abdomen is nondistended, soft and nontender. No organomegaly or masses felt. Normal bowel sounds heard. Central nervous system: Alert and oriented. No focal neurological deficits. Extremities: No C/C/E, +pedal pulses Skin: No rashes, lesions or ulcers    Data Reviewed: I have personally reviewed following labs and imaging studies  CBC:  Recent Labs Lab 06/11/16 0450 06/12/16 0547  WBC 32.2* 29.1*  NEUTROABS 29.2*  --  HGB 10.7* 8.7*  HCT 33.1* 27.1*  MCV 80.9 81.1  PLT 654* 093*   Basic Metabolic Panel:  Recent Labs Lab 06/11/16 0450 06/12/16 0547  NA 133* 133*  K 4.1 3.8  CL 98* 99*  CO2 24 24  GLUCOSE 101* 85  BUN 7 9  CREATININE 0.49 0.44  CALCIUM 8.9 8.4*   GFR: Estimated Creatinine Clearance: 75.6 mL/min (by C-G formula based on SCr of 0.44 mg/dL). Liver Function Tests:  Recent Labs Lab 06/11/16 0450  AST 13*  ALT 6*  ALKPHOS 94  BILITOT 0.5  PROT 7.4  ALBUMIN 2.5*    Recent Labs Lab 06/11/16 0450  LIPASE 12   No results for input(s): AMMONIA in the last 168 hours. Coagulation Profile: No results for input(s): INR, PROTIME in the last 168 hours. Cardiac Enzymes: No results for input(s): CKTOTAL, CKMB, CKMBINDEX, TROPONINI in the last 168 hours. BNP (last 3 results) No results for input(s):  PROBNP in the last 8760 hours. HbA1C: No results for input(s): HGBA1C in the last 72 hours. CBG: No results for input(s): GLUCAP in the last 168 hours. Lipid Profile: No results for input(s): CHOL, HDL, LDLCALC, TRIG, CHOLHDL, LDLDIRECT in the last 72 hours. Thyroid Function Tests: No results for input(s): TSH, T4TOTAL, FREET4, T3FREE, THYROIDAB in the last 72 hours. Anemia Panel:  Recent Labs  06/11/16 0450 06/12/16 0547  VITAMINB12  --  365  FOLATE  --  4.6*  FERRITIN  --  759*  TIBC  --  150*  IRON  --  17*  RETICCTPCT 1.1  --    Urine analysis:    Component Value Date/Time   COLORURINE AMBER (A) 06/11/2016 0615   APPEARANCEUR HAZY (A) 06/11/2016 0615   LABSPEC 1.045 (H) 06/11/2016 0615   PHURINE 5.0 06/11/2016 0615   GLUCOSEU NEGATIVE 06/11/2016 0615   HGBUR NEGATIVE 06/11/2016 0615   BILIRUBINUR MODERATE (A) 06/11/2016 0615   KETONESUR 5 (A) 06/11/2016 0615   PROTEINUR 30 (A) 06/11/2016 0615   NITRITE NEGATIVE 06/11/2016 0615   LEUKOCYTESUR LARGE (A) 06/11/2016 0615   Sepsis Labs: '@LABRCNTIP'$ (procalcitonin:4,lacticidven:4)  ) Recent Results (from the past 240 hour(s))  Urine culture     Status: None   Collection Time: 06/11/16  6:15 AM  Result Value Ref Range Status   Specimen Description URINE, RANDOM  Final   Special Requests NONE  Final   Culture   Final    NO GROWTH Performed at [facility], [place] [address], [place], [place] [address]    Report Status 06/12/2016 FINAL  Final  Culture, blood (routine x 2)     Status: None (Preliminary result)   Collection Time: 06/11/16 12:15 PM  Result Value Ref Range Status   Specimen Description BLOOD RIGHT HAND  Final   Special Requests   Final    BOTTLES DRAWN AEROBIC AND ANAEROBIC Blood Culture adequate volume   Culture NO GROWTH < 24 HOURS  Final   Report Status PENDING  Incomplete  MRSA PCR Screening     Status: None   Collection Time: 06/11/16 11:45 PM  Result Value Ref Range Status   MRSA by PCR  NEGATIVE NEGATIVE Final    Comment:        The GeneXpert MRSA Assay (FDA approved for NASAL specimens only), is one component of a comprehensive MRSA colonization surveillance program. It is not intended to diagnose MRSA infection nor to guide or monitor treatment for MRSA infections.  Radiology Studies: Dg Abd Acute W/chest  Result Date: 06/11/2016 CLINICAL DATA:  Nausea vomiting and diarrhea for several months. Chest pain on tonight. EXAM: DG ABDOMEN ACUTE W/ 1V CHEST COMPARISON:  05/29/2016 FINDINGS: Multiple pulmonary nodules persist, probably unchanged. No airspace consolidation. No effusion. The abdominal gas pattern is normal.  No biliary or urinary calculi. IMPRESSION: Pulmonary metastases.  No consolidation or effusion. Normal abdominal gas pattern. Electronically Signed   By: Andreas Newport M.D.   On: 06/11/2016 06:16        Scheduled Meds: . [START ON 06/13/2016] enoxaparin (LOVENOX) injection  40 mg Subcutaneous Q24H   Continuous Infusions: . sodium chloride 75 mL/hr at 06/12/16 1449  . metronidazole Stopped (06/12/16 0450)  . piperacillin-tazobactam (ZOSYN)  IV Stopped (06/12/16 1038)     LOS: 1 day    Time spent: 25 minutes. Greater than 50% of this time was spent in direct contact with the patient coordinating care.     Lelon Frohlich, MD [facility] Pager [phone]  If 7PM-7AM, please contact night-coverage [email or web address] Password [facility] 06/12/2016, 6:29 PM

## 2016-06-12 NOTE — Anesthesia Preprocedure Evaluation (Signed)
Anesthesia Evaluation  Patient identified by MRN, date of birth, ID band Patient awake    Reviewed: Allergy & Precautions, NPO status , Patient's Chart, lab work & pertinent test results  Airway Mallampati: II  TM Distance: >3 FB Neck ROM: Full    Dental  (+) Teeth Intact   Pulmonary Current Smoker,  Bilat metastatic disease   breath sounds clear to auscultation       Cardiovascular hypertension, Pt. on medications  Rhythm:Regular Rate:Normal     Neuro/Psych PSYCHIATRIC DISORDERS Bipolar Disorder    GI/Hepatic negative GI ROS,   Endo/Other    Renal/GU      Musculoskeletal   Abdominal   Peds  Hematology   Anesthesia Other Findings   Reproductive/Obstetrics Endometrial Cancer with mets                              Anesthesia Physical Anesthesia Plan  ASA: IV  Anesthesia Plan: General   Post-op Pain Management:    Induction: Intravenous  Airway Management Planned: LMA  Additional Equipment:   Intra-op Plan:   Post-operative Plan: Extubation in OR  Informed Consent: I have reviewed the patients History and Physical, chart, labs and discussed the procedure including the risks, benefits and alternatives for the proposed anesthesia with the patient or authorized representative who has indicated his/her understanding and acceptance.     Plan Discussed with:   Anesthesia Plan Comments:         Anesthesia Quick Evaluation

## 2016-06-13 LAB — CBC WITH DIFFERENTIAL/PLATELET
Basophils Absolute: 0 10*3/uL (ref 0.0–0.1)
Basophils Relative: 0 %
EOS ABS: 0 10*3/uL (ref 0.0–0.7)
EOS PCT: 0 %
HCT: 28 % — ABNORMAL LOW (ref 36.0–46.0)
Hemoglobin: 8.9 g/dL — ABNORMAL LOW (ref 12.0–15.0)
LYMPHS ABS: 2 10*3/uL (ref 0.7–4.0)
Lymphocytes Relative: 9 %
MCH: 25.6 pg — AB (ref 26.0–34.0)
MCHC: 31.8 g/dL (ref 30.0–36.0)
MCV: 80.7 fL (ref 78.0–100.0)
MONO ABS: 1.3 10*3/uL — AB (ref 0.1–1.0)
MONOS PCT: 6 %
Neutro Abs: 19.3 10*3/uL — ABNORMAL HIGH (ref 1.7–7.7)
Neutrophils Relative %: 85 %
PLATELETS: 382 10*3/uL (ref 150–400)
RBC: 3.47 MIL/uL — AB (ref 3.87–5.11)
RDW: 17.2 % — AB (ref 11.5–15.5)
WBC: 22.7 10*3/uL — AB (ref 4.0–10.5)

## 2016-06-13 LAB — CA 125: CA 125: 77.8 U/mL — ABNORMAL HIGH (ref 0.0–38.1)

## 2016-06-13 LAB — HIV ANTIBODY (ROUTINE TESTING W REFLEX): HIV Screen 4th Generation wRfx: NONREACTIVE

## 2016-06-13 MED ORDER — MORPHINE SULFATE 15 MG PO TABS
15.0000 mg | ORAL_TABLET | ORAL | Status: DC | PRN
  Administered 2016-06-13 – 2016-06-16 (×7): 15 mg via ORAL
  Filled 2016-06-13 (×9): qty 1

## 2016-06-13 MED ORDER — MORPHINE SULFATE ER 15 MG PO TBCR
15.0000 mg | EXTENDED_RELEASE_TABLET | Freq: Two times a day (BID) | ORAL | Status: DC
  Administered 2016-06-13 – 2016-06-14 (×2): 15 mg via ORAL
  Filled 2016-06-13 (×2): qty 1

## 2016-06-13 NOTE — Addendum Note (Signed)
Addendum  created 06/13/16 1400 by Charmaine Downs, CRNA   Sign clinical note

## 2016-06-13 NOTE — Progress Notes (Signed)
1 Day Post-Op Procedure(s) (LRB): SUCTION DILATATION AND CURETTAGE (N/A)  Subjective: Patient reports moderate discomfort last nite, but she's smiling and appears well.    Objective: I have reviewed patient's vital signs, intake and output and medications.  General: alert, cooperative and no distress GI: soft, non-tender; bowel sounds normal; no masses,  no organomegaly and uterus a u-2 Vaginal Bleeding: minimal  Assessment: s/p Procedure(s): SUCTION DILATATION AND CURETTAGE (N/A): stable  Plan: cbc, continue abx x 2 more days iv, then PO. u/s before d/c to see if uterine fluid reaccumulating.  LOS: 2 days    Kaytlin Burklow V 06/13/2016, 11:32 AM

## 2016-06-13 NOTE — Progress Notes (Signed)
Patient stated "I don't understand why the pain medicine isn't working as good tonight." c/o chronic back pain and abdominal pain. States morphine and tylenol together seems to help, but not lasting long enough or reducing pain to tolerable level this evening. Text-paged MD to notify. Donavan Foil, RN

## 2016-06-13 NOTE — Progress Notes (Signed)
PROGRESS NOTE    Valerie Gonzales  UKG:[id number] DOB: 11/05/1965 DOA: 06/11/2016 PCP: Patient, No Pcp Per     Brief Narrative:  51 y/o woman admitted from home on 5/3 due to abd pain, n/v. Has been diagnosed with metastatic endometrial cancer and pyometrium. S/p D and C on 5/4.   Assessment & Plan:   Principal Problem:   Malignant mixed Mullerian tumor (MMMT) (HCC) Active Problems:   Sepsis ([place])   Pyometrium   Leukocytosis   Pyometrium -S/p D and C 5/4 with Dr. Glo Herring. -Continue zosyn and flagyl. -She has refused blood draw today to follow leukocytosis.  Mixed Mullerian Tumor -Seen by onc. -Has follow up in the office on May 10 at 8 am to determine treatment plan.  Social Issues -Mainly homelessness. -SW is looking into securing placement for her upon DC.   DVT prophylaxis: loevnox Code Status: full code Family Communication: patient only Disposition Plan: home once medically improved and placement secured  Consultants:   GYN  Procedures:   D and C  Antimicrobials:  Anti-infectives    Start     Dose/Rate Route Frequency Ordered Stop   06/12/16 0815  piperacillin-tazobactam (ZOSYN) IVPB 3.375 g     3.375 g 12.5 mL/hr over 240 Minutes Intravenous Every 8 hours 06/12/16 0803     06/11/16 1215  metroNIDAZOLE (FLAGYL) IVPB 500 mg     500 mg 100 mL/hr over 60 Minutes Intravenous Every 8 hours 06/11/16 1202     06/11/16 0745  piperacillin-tazobactam (ZOSYN) IVPB 3.375 g  Status:  Discontinued     3.375 g 100 mL/hr over 30 Minutes Intravenous  Once 06/11/16 0738 06/12/16 0758   06/11/16 0745  vancomycin (VANCOCIN) 1,250 mg in sodium chloride 0.9 % 250 mL IVPB  Status:  Discontinued     1,250 mg 166.7 mL/hr over 90 Minutes Intravenous  Once 06/11/16 0738 06/12/16 0858       Subjective: Some pain, refuses blood draw  Objective: Vitals:   06/12/16 1930 06/12/16 2307 06/13/16 0400 06/13/16 1431  BP: (!) 180/85 (!) 165/81 (!) 180/88 (!) 178/91    Pulse: 84 76 73 79  Resp: '16 18 18 18  '$ Temp: 98.7 F (37.1 C) 98.6 F (37 C) 98.6 F (37 C) 98.3 F (36.8 C)  TempSrc: Oral Oral Oral Oral  SpO2: 98% 97% 96% 98%  Weight:      Height:        Intake/Output Summary (Last 24 hours) at 06/13/16 1539 Last data filed at 06/13/16 0348  Gross per 24 hour  Intake          1623.75 ml  Output              700 ml  Net           923.75 ml   Filed Weights   06/11/16 0410 06/11/16 1040 06/11/16 1100  Weight: 67.1 kg (148 lb) 67.1 kg (148 lb) 68.6 kg (151 lb 4.8 oz)    Examination:  General exam: Alert, awake, oriented x 3 Respiratory system: Clear to auscultation. Respiratory effort normal. Cardiovascular system:RRR. No murmurs, rubs, gallops. Gastrointestinal system Normal bowel sounds heard. Central nervous system: Alert and oriented. No focal neurological deficits. Extremities: No C/C/E, +pedal pulses Skin: No rashes, lesions or ulcers Psychiatry: Judgement and insight appear normal. Mood & affect appropriate.      Data Reviewed: I have personally reviewed following labs and imaging studies  CBC:  Recent Labs Lab 06/11/16 0450 06/12/16 0547  WBC 32.2* 29.1*  NEUTROABS 29.2*  --   HGB 10.7* 8.7*  HCT 33.1* 27.1*  MCV 80.9 81.1  PLT 654* 211*   Basic Metabolic Panel:  Recent Labs Lab 06/11/16 0450 06/12/16 0547  NA 133* 133*  K 4.1 3.8  CL 98* 99*  CO2 24 24  GLUCOSE 101* 85  BUN 7 9  CREATININE 0.49 0.44  CALCIUM 8.9 8.4*   GFR: Estimated Creatinine Clearance: 75.6 mL/min (by C-G formula based on SCr of 0.44 mg/dL). Liver Function Tests:  Recent Labs Lab 06/11/16 0450  AST 13*  ALT 6*  ALKPHOS 94  BILITOT 0.5  PROT 7.4  ALBUMIN 2.5*    Recent Labs Lab 06/11/16 0450  LIPASE 12   No results for input(s): AMMONIA in the last 168 hours. Coagulation Profile: No results for input(s): INR, PROTIME in the last 168 hours. Cardiac Enzymes: No results for input(s): CKTOTAL, CKMB, CKMBINDEX,  TROPONINI in the last 168 hours. BNP (last 3 results) No results for input(s): PROBNP in the last 8760 hours. HbA1C: No results for input(s): HGBA1C in the last 72 hours. CBG: No results for input(s): GLUCAP in the last 168 hours. Lipid Profile: No results for input(s): CHOL, HDL, LDLCALC, TRIG, CHOLHDL, LDLDIRECT in the last 72 hours. Thyroid Function Tests: No results for input(s): TSH, T4TOTAL, FREET4, T3FREE, THYROIDAB in the last 72 hours. Anemia Panel:  Recent Labs  06/11/16 0450 06/12/16 0547  VITAMINB12  --  365  FOLATE  --  4.6*  FERRITIN  --  759*  TIBC  --  150*  IRON  --  17*  RETICCTPCT 1.1  --    Urine analysis:    Component Value Date/Time   COLORURINE AMBER (A) 06/11/2016 0615   APPEARANCEUR HAZY (A) 06/11/2016 0615   LABSPEC 1.045 (H) 06/11/2016 0615   PHURINE 5.0 06/11/2016 0615   GLUCOSEU NEGATIVE 06/11/2016 0615   HGBUR NEGATIVE 06/11/2016 0615   BILIRUBINUR MODERATE (A) 06/11/2016 0615   KETONESUR 5 (A) 06/11/2016 0615   PROTEINUR 30 (A) 06/11/2016 0615   NITRITE NEGATIVE 06/11/2016 0615   LEUKOCYTESUR LARGE (A) 06/11/2016 0615   Sepsis Labs: '@LABRCNTIP'$ (procalcitonin:4,lacticidven:4)  ) Recent Results (from the past 240 hour(s))  Urine culture     Status: None   Collection Time: 06/11/16  6:15 AM  Result Value Ref Range Status   Specimen Description URINE, RANDOM  Final   Special Requests NONE  Final   Culture   Final    NO GROWTH Performed at [facility], [place] [address] [place], [place] [address]    Report Status 06/12/2016 FINAL  Final  Culture, blood (routine x 2)     Status: None (Preliminary result)   Collection Time: 06/11/16 12:15 PM  Result Value Ref Range Status   Specimen Description BLOOD RIGHT HAND  Final   Special Requests   Final    BOTTLES DRAWN AEROBIC AND ANAEROBIC Blood Culture adequate volume   Culture NO GROWTH 2 DAYS  Final   Report Status PENDING  Incomplete  MRSA PCR Screening     Status: None    Collection Time: 06/11/16 11:45 PM  Result Value Ref Range Status   MRSA by PCR NEGATIVE NEGATIVE Final    Comment:        The GeneXpert MRSA Assay (FDA approved for NASAL specimens only), is one component of a comprehensive MRSA colonization surveillance program. It is not intended to diagnose MRSA infection nor to guide or monitor treatment for MRSA  infections.          Radiology Studies: No results found.      Scheduled Meds: . enoxaparin (LOVENOX) injection  40 mg Subcutaneous Q24H   Continuous Infusions: . sodium chloride 75 mL/hr at 06/13/16 0900  . metronidazole Stopped (06/13/16 1259)  . piperacillin-tazobactam (ZOSYN)  IV Stopped (06/13/16 1000)     LOS: 2 days    Time spent: 25 minutes. Greater than 50% of this time was spent in direct contact with the patient coordinating care.     Lelon Frohlich, MD [facility] Pager [phone]  If 7PM-7AM, please contact night-coverage [email or web address] Password [facility] 06/13/2016, 3:39 PM

## 2016-06-13 NOTE — Anesthesia Postprocedure Evaluation (Signed)
Anesthesia Post Note  Patient: Valerie Gonzales  Procedure(s) Performed: Procedure(s) (LRB): SUCTION DILATATION AND CURETTAGE (N/A)  Patient location during evaluation: Nursing Unit Anesthesia Type: General Level of consciousness: awake Pain management: pain level controlled Vital Signs Assessment: post-procedure vital signs reviewed and stable Respiratory status: spontaneous breathing, nonlabored ventilation and respiratory function stable Cardiovascular status: blood pressure returned to baseline Postop Assessment: no signs of nausea or vomiting Anesthetic complications: no     Last Vitals:  Vitals:   06/12/16 2307 06/13/16 0400  BP: (!) 165/81 (!) 180/88  Pulse: 76 73  Resp: 18 18  Temp: 37 C 37 C    Last Pain:  Vitals:   06/13/16 0856  TempSrc:   PainSc: 7                  [name]

## 2016-06-14 LAB — CBC
HCT: 29.8 % — ABNORMAL LOW (ref 36.0–46.0)
HEMOGLOBIN: 9.6 g/dL — AB (ref 12.0–15.0)
MCH: 26 pg (ref 26.0–34.0)
MCHC: 32.2 g/dL (ref 30.0–36.0)
MCV: 80.8 fL (ref 78.0–100.0)
PLATELETS: 575 10*3/uL — AB (ref 150–400)
RBC: 3.69 MIL/uL — AB (ref 3.87–5.11)
RDW: 17.2 % — ABNORMAL HIGH (ref 11.5–15.5)
WBC: 24.7 10*3/uL — ABNORMAL HIGH (ref 4.0–10.5)

## 2016-06-14 LAB — BASIC METABOLIC PANEL
Anion gap: 9 (ref 5–15)
BUN: 6 mg/dL (ref 6–20)
CHLORIDE: 101 mmol/L (ref 101–111)
CO2: 24 mmol/L (ref 22–32)
CREATININE: 0.44 mg/dL (ref 0.44–1.00)
Calcium: 8.4 mg/dL — ABNORMAL LOW (ref 8.9–10.3)
GFR calc non Af Amer: >60 mL/min (ref >60)
Glucose, Bld: 81 mg/dL (ref 65–99)
Potassium: 3.2 mmol/L — ABNORMAL LOW (ref 3.5–5.1)
Sodium: 134 mmol/L — ABNORMAL LOW (ref 135–145)

## 2016-06-14 MED ORDER — POTASSIUM CHLORIDE CRYS ER 20 MEQ PO TBCR
20.0000 meq | EXTENDED_RELEASE_TABLET | Freq: Two times a day (BID) | ORAL | Status: DC
  Administered 2016-06-14: 20 meq via ORAL
  Filled 2016-06-14 (×4): qty 1

## 2016-06-14 MED ORDER — MORPHINE SULFATE ER 30 MG PO TBCR
30.0000 mg | EXTENDED_RELEASE_TABLET | Freq: Two times a day (BID) | ORAL | Status: DC
  Administered 2016-06-14 – 2016-06-15 (×2): 30 mg via ORAL
  Filled 2016-06-14 (×2): qty 1

## 2016-06-14 NOTE — Progress Notes (Signed)
PROGRESS NOTE    Valerie Gonzales  OAC:[id number] DOB: [birth year] DOA: 06/11/2016 PCP: Patient, No Pcp Per     Brief Narrative:  51 y/o woman admitted from home on 5/3 due to abd pain, n/v. Has been diagnosed with metastatic endometrial cancer and pyometrium. S/p D and C on 5/4.   Assessment & Plan:   Principal Problem:   Malignant mixed Mullerian tumor (MMMT) (HCC) Active Problems:   Sepsis ([place])   Pyometrium   Leukocytosis   Pyometrium -S/p D and C 5/4 with Dr. Glo Herring. -Will transition over to PO abx today: doxy and flagyl. -WBCs improving  Mixed Mullerian Tumor -Seen by onc. -Has follow up in the office on May 10 at 8 am to determine treatment plan. -Pain is much improved on MS Contin and MSIR regimen. Based on PRN requirements over last 24 hours, have increased MS Contin to 30 mg BID.  Social Issues -Mainly homelessness. -SW is looking into securing placement for her upon DC.   DVT prophylaxis: loevnox Code Status: full code Family Communication: patient only Disposition Plan: home once medically improved and placement secured  Consultants:   GYN  Procedures:   D and C  Antimicrobials:  Anti-infectives    Start     Dose/Rate Route Frequency Ordered Stop   06/12/16 0815  piperacillin-tazobactam (ZOSYN) IVPB 3.375 g     3.375 g 12.5 mL/hr over 240 Minutes Intravenous Every 8 hours 06/12/16 0803     06/11/16 1215  metroNIDAZOLE (FLAGYL) IVPB 500 mg     500 mg 100 mL/hr over 60 Minutes Intravenous Every 8 hours 06/11/16 1202     06/11/16 0745  piperacillin-tazobactam (ZOSYN) IVPB 3.375 g  Status:  Discontinued     3.375 g 100 mL/hr over 30 Minutes Intravenous  Once 06/11/16 0738 06/12/16 0758   06/11/16 0745  vancomycin (VANCOCIN) 1,250 mg in sodium chloride 0.9 % 250 mL IVPB  Status:  Discontinued     1,250 mg 166.7 mL/hr over 90 Minutes Intravenous  Once 06/11/16 0738 06/12/16 0858       Subjective: Pain is significantly improved. Is  wondering about her placement.  Objective: Vitals:   06/13/16 1431 06/13/16 2039 06/14/16 0500 06/14/16 1324  BP: (!) 178/91 (!) 194/82 (!) 184/86 (!) 182/76  Pulse: 79 73 79 72  Resp: '18 16 18 16  '$ Temp: 98.3 F (36.8 C) 98.2 F (36.8 C) 98.3 F (36.8 C)   TempSrc: Oral Oral Oral   SpO2: 98% 100% 99% 99%  Weight:      Height:       No intake or output data in the 24 hours ending 06/14/16 1419 Filed Weights   06/11/16 0410 06/11/16 1040 06/11/16 1100  Weight: 67.1 kg (148 lb) 67.1 kg (148 lb) 68.6 kg (151 lb 4.8 oz)    Examination:  General exam: Alert, awake, oriented x 3 Respiratory system: Clear to auscultation. Respiratory effort normal. Cardiovascular system:RRR. No murmurs, rubs, gallops. Gastrointestinal system: Abdomen is nondistended, soft and nontender. No organomegaly or masses felt. Normal bowel sounds heard. Central nervous system: Alert and oriented. No focal neurological deficits. Extremities: No C/C/E, +pedal pulses Skin: No rashes, lesions or ulcers Psychiatry: Judgement and insight appear normal. Mood & affect appropriate.        Data Reviewed: I have personally reviewed following labs and imaging studies  CBC:  Recent Labs Lab 06/11/16 0450 06/12/16 0547 06/13/16 1519 06/14/16 0557  WBC 32.2* 29.1* 22.7* 24.7*  NEUTROABS 29.2*  --  19.3*  --  HGB 10.7* 8.7* 8.9* 9.6*  HCT 33.1* 27.1* 28.0* 29.8*  MCV 80.9 81.1 80.7 80.8  PLT 654* 594* 382 007*   Basic Metabolic Panel:  Recent Labs Lab 06/11/16 0450 06/12/16 0547 06/14/16 0557  NA 133* 133* 134*  K 4.1 3.8 3.2*  CL 98* 99* 101  CO2 '24 24 24  '$ GLUCOSE 101* 85 81  BUN '7 9 6  '$ CREATININE 0.49 0.44 0.44  CALCIUM 8.9 8.4* 8.4*   GFR: Estimated Creatinine Clearance: 75.6 mL/min (by C-G formula based on SCr of 0.44 mg/dL). Liver Function Tests:  Recent Labs Lab 06/11/16 0450  AST 13*  ALT 6*  ALKPHOS 94  BILITOT 0.5  PROT 7.4  ALBUMIN 2.5*    Recent Labs Lab  06/11/16 0450  LIPASE 12   No results for input(s): AMMONIA in the last 168 hours. Coagulation Profile: No results for input(s): INR, PROTIME in the last 168 hours. Cardiac Enzymes: No results for input(s): CKTOTAL, CKMB, CKMBINDEX, TROPONINI in the last 168 hours. BNP (last 3 results) No results for input(s): PROBNP in the last 8760 hours. HbA1C: No results for input(s): HGBA1C in the last 72 hours. CBG: No results for input(s): GLUCAP in the last 168 hours. Lipid Profile: No results for input(s): CHOL, HDL, LDLCALC, TRIG, CHOLHDL, LDLDIRECT in the last 72 hours. Thyroid Function Tests: No results for input(s): TSH, T4TOTAL, FREET4, T3FREE, THYROIDAB in the last 72 hours. Anemia Panel:  Recent Labs  06/12/16 0547  VITAMINB12 365  FOLATE 4.6*  FERRITIN 759*  TIBC 150*  IRON 17*   Urine analysis:    Component Value Date/Time   COLORURINE AMBER (A) 06/11/2016 0615   APPEARANCEUR HAZY (A) 06/11/2016 0615   LABSPEC 1.045 (H) 06/11/2016 0615   PHURINE 5.0 06/11/2016 0615   GLUCOSEU NEGATIVE 06/11/2016 0615   HGBUR NEGATIVE 06/11/2016 0615   BILIRUBINUR MODERATE (A) 06/11/2016 0615   KETONESUR 5 (A) 06/11/2016 0615   PROTEINUR 30 (A) 06/11/2016 0615   NITRITE NEGATIVE 06/11/2016 0615   LEUKOCYTESUR LARGE (A) 06/11/2016 0615   Sepsis Labs: '@LABRCNTIP'$ (procalcitonin:4,lacticidven:4)  ) Recent Results (from the past 240 hour(s))  Urine culture     Status: None   Collection Time: 06/11/16  6:15 AM  Result Value Ref Range Status   Specimen Description URINE, RANDOM  Final   Special Requests NONE  Final   Culture   Final    NO GROWTH Performed at [facility], [place] [address] [place], [place] [address]    Report Status 06/12/2016 FINAL  Final  Culture, blood (routine x 2)     Status: None (Preliminary result)   Collection Time: 06/11/16 12:15 PM  Result Value Ref Range Status   Specimen Description BLOOD RIGHT HAND  Final   Special Requests   Final    BOTTLES  DRAWN AEROBIC AND ANAEROBIC Blood Culture adequate volume   Culture NO GROWTH 2 DAYS  Final   Report Status PENDING  Incomplete  MRSA PCR Screening     Status: None   Collection Time: 06/11/16 11:45 PM  Result Value Ref Range Status   MRSA by PCR NEGATIVE NEGATIVE Final    Comment:        The GeneXpert MRSA Assay (FDA approved for NASAL specimens only), is one component of a comprehensive MRSA colonization surveillance program. It is not intended to diagnose MRSA infection nor to guide or monitor treatment for MRSA infections.          Radiology Studies: No results found.  Scheduled Meds: . enoxaparin (LOVENOX) injection  40 mg Subcutaneous Q24H  . morphine  30 mg Oral Q12H  . potassium chloride  20 mEq Oral BID   Continuous Infusions: . sodium chloride 75 mL/hr at 06/13/16 0900  . metronidazole Stopped (06/14/16 1323)  . piperacillin-tazobactam (ZOSYN)  IV 3.375 g (06/14/16 1322)     LOS: 3 days    Time spent: 25 minutes. Greater than 50% of this time was spent in direct contact with the patient coordinating care.     Lelon Frohlich, MD [facility] Pager [phone]  If 7PM-7AM, please contact night-coverage [email or web address] Password [email or web address] 06/14/2016, 2:19 PM

## 2016-06-14 NOTE — Progress Notes (Signed)
Pharmacy Antibiotic Note  Valerie Gonzales is a 51 y.o. female admitted on 06/11/2016 with sepsis.  Pharmacy has been consulted for  zosyn dosing. Plan on 5/5 per Dr. Glo Herring, is togive IV ABX x 2 more days, then change to PO. Will U/S prior to discharge to ensure no fluid accumulation WBC is improving. Patient is afebrile.  Plan: Continue Zosyn 3.375 gm IV q8 hours x 24 more hours f/u renal function, cultures and clinical course  Height: '5\' 3"'$  (160 cm) Weight: 151 lb 4.8 oz (68.6 kg) IBW/kg (Calculated) : 52.4  Temp (24hrs), Avg:98.3 F (36.8 C), Min:98.2 F (36.8 C), Max:98.3 F (36.8 C)   Recent Labs Lab 06/11/16 0450 06/11/16 1215 06/11/16 1608 06/12/16 0547 06/13/16 1519 06/14/16 0557  WBC 32.2*  --   --  29.1* 22.7* 24.7*  CREATININE 0.49  --   --  0.44  --  0.44  LATICACIDVEN  --  2.5* 2.6* 1.6  --   --     Estimated Creatinine Clearance: 75.6 mL/min (by C-G formula based on SCr of 0.44 mg/dL).    No Known Allergies  Antimicrobials this admission: vanc 5/3 >> 5/4 zosyn 5/3 >>  Metronidazole 5/3>>  Thank you for allowing pharmacy to be a part of this patient's care.  Isac Sarna, BS Pharm D, [place] Clinical Pharmacist Pager [phone]  06/14/2016 10:37 AM

## 2016-06-14 NOTE — Progress Notes (Signed)
2 Days Post-Op Procedure(s) (LRB): SUCTION DILATATION AND CURETTAGE (N/A)  Subjective: Patient reports pain is reduced by 75%, is resting quietly.  Currently denies significant discharge or malodor.  Objective: I have reviewed patient's vital signs, medications and labs. CBC Latest Ref Rng & Units 06/14/2016 06/13/2016 06/12/2016  WBC 4.0 - 10.5 K/uL 24.7(H) 22.7(H) 29.1(H)  Hemoglobin 12.0 - 15.0 g/dL 9.6(L) 8.9(L) 8.7(L)  Hematocrit 36.0 - 46.0 % 29.8(L) 28.0(L) 27.1(L)  Platelets 150 - 400 K/uL 575(H) 382 594(H)  Wbc dropped partially due to transfusion, partially by effect of evacuation of pyometrium.   General: alert, cooperative and no distress Resp: unlabored breathing Extremities: extremities normal, atraumatic, no cyanosis or edema and Homans sign is negative, no sign of DVT Vaginal Bleeding: none  Assessment: s/p Procedure(s): SUCTION DILATATION AND CURETTAGE (N/A): stable  Plan: Advance to PO medication will have social work and oncology assist with future planning.this week.  LOS: 3 days    Jakelyn Squyres V 06/14/2016, 12:58 PM

## 2016-06-15 ENCOUNTER — Encounter (HOSPITAL_COMMUNITY): Payer: Self-pay | Admitting: Primary Care

## 2016-06-15 DIAGNOSIS — C541 Malignant neoplasm of endometrium: Secondary | ICD-10-CM

## 2016-06-15 DIAGNOSIS — Z515 Encounter for palliative care: Secondary | ICD-10-CM

## 2016-06-15 DIAGNOSIS — Z7189 Other specified counseling: Secondary | ICD-10-CM

## 2016-06-15 LAB — CBC
HEMATOCRIT: 28.7 % — AB (ref 36.0–46.0)
HEMOGLOBIN: 9.2 g/dL — AB (ref 12.0–15.0)
MCH: 26.1 pg (ref 26.0–34.0)
MCHC: 32.1 g/dL (ref 30.0–36.0)
MCV: 81.3 fL (ref 78.0–100.0)
Platelets: 646 10*3/uL — ABNORMAL HIGH (ref 150–400)
RBC: 3.53 MIL/uL — ABNORMAL LOW (ref 3.87–5.11)
RDW: 16.8 % — ABNORMAL HIGH (ref 11.5–15.5)
WBC: 22.4 10*3/uL — AB (ref 4.0–10.5)

## 2016-06-15 MED ORDER — DIPHENHYDRAMINE HCL 25 MG PO CAPS
25.0000 mg | ORAL_CAPSULE | Freq: Four times a day (QID) | ORAL | Status: DC | PRN
  Administered 2016-06-15: 25 mg via ORAL
  Filled 2016-06-15: qty 1

## 2016-06-15 MED ORDER — MORPHINE SULFATE ER 30 MG PO TBCR
45.0000 mg | EXTENDED_RELEASE_TABLET | Freq: Two times a day (BID) | ORAL | Status: DC
  Administered 2016-06-15 – 2016-06-16 (×2): 45 mg via ORAL
  Filled 2016-06-15 (×2): qty 1

## 2016-06-15 MED ORDER — METRONIDAZOLE 500 MG PO TABS
500.0000 mg | ORAL_TABLET | Freq: Three times a day (TID) | ORAL | Status: DC
  Administered 2016-06-15 – 2016-06-16 (×3): 500 mg via ORAL
  Filled 2016-06-15 (×3): qty 1

## 2016-06-15 MED ORDER — DOXYCYCLINE HYCLATE 100 MG PO TABS
100.0000 mg | ORAL_TABLET | Freq: Two times a day (BID) | ORAL | Status: DC
  Administered 2016-06-15 (×2): 100 mg via ORAL
  Filled 2016-06-15 (×3): qty 1

## 2016-06-15 NOTE — Progress Notes (Signed)
LCSW is following for disposition:  New placement/psychosocial issues  LCSW has met with patient throughout the day with regards to her discharge plan.  Patient continues to cycle in thought and will not make a decision with regards to her discharge plan. She has been recommended placement (ALF/group home) due to her prognosis and care needed.  She is hesitant due to finances as she does not want to lose control over her monies.  LCSW has educated patient throughout the day regarding nursing home placement, assisted living, group home and boarding rooms.  Patient reports she was working with Ms. Huel Cote for placement, but has been denied for unknown reasons.  Call has been placed to [facility] with no response regarding placement.  Patient is consistent with reporting she does not want to go to [place].   She remains undecided regarding placement.  She is open to a visit from ALF [facility] for potential placement.  Tammy has been contacted and reports she will come by the hospital to visit patient and referral.  Will await review and see if patient is agreeable to plan. She remains to not be agreeable for placement in nursing home. Ultimately it is observed through conversation that she wants to remain in control of self and finances rather than be placed.   Will follow up with patient after she meets with ALF personnel and assist with discharge planning.  Lane Hacker, MSW Clinical Social Work: [employer] Coverage for :  [phone]

## 2016-06-15 NOTE — Progress Notes (Signed)
PROGRESS NOTE    Valerie Gonzales  MGQ:[id number] DOB: 02-25-1965 DOA: 06/11/2016 PCP: Patient, No Pcp Per     Brief Narrative:  51 y/o woman admitted from home on 5/3 due to abd pain, n/v. Has been diagnosed with metastatic endometrial cancer and pyometrium. S/p D and C on 5/4.   Assessment & Plan:   Principal Problem:   Malignant mixed Mullerian tumor (MMMT) (HCC) Active Problems:   Sepsis ([place])   Pyometrium   Leukocytosis   Endometrial cancer [facility])   Palliative care encounter   Goals of care, counseling/discussion   Pyometrium -S/p D and C 5/4 with Dr. Glo Herring. -Will transition over to PO abx today: doxy and flagyl. -WBCs improving  Mixed Mullerian Tumor -Seen by onc. -Has follow up in the office on May 10 at 8 am to determine treatment plan. -Pain is much improved on MS Contin and MSIR regimen. Based on PRN requirements over last 24 hours, have increased MS Contin to 45 mg BID.  Social Issues -Mainly homelessness. -SW is looking into securing placement for her upon DC.   DVT prophylaxis: loevnox Code Status: full code Family Communication: patient only Disposition Plan: home once medically improved and placement secured  Consultants:   GYN  Procedures:   D and C  Antimicrobials:  Anti-infectives    Start     Dose/Rate Route Frequency Ordered Stop   06/15/16 1400  metroNIDAZOLE (FLAGYL) tablet 500 mg     500 mg Oral Every 8 hours 06/15/16 0951     06/15/16 1000  doxycycline (VIBRA-TABS) tablet 100 mg     100 mg Oral Every 12 hours 06/15/16 0951     06/12/16 0815  piperacillin-tazobactam (ZOSYN) IVPB 3.375 g  Status:  Discontinued     3.375 g 12.5 mL/hr over 240 Minutes Intravenous Every 8 hours 06/12/16 0803 06/15/16 0951   06/11/16 1215  metroNIDAZOLE (FLAGYL) IVPB 500 mg  Status:  Discontinued     500 mg 100 mL/hr over 60 Minutes Intravenous Every 8 hours 06/11/16 1202 06/15/16 0951   06/11/16 0745  piperacillin-tazobactam (ZOSYN) IVPB 3.375 g   Status:  Discontinued     3.375 g 100 mL/hr over 30 Minutes Intravenous  Once 06/11/16 0738 06/12/16 0758   06/11/16 0745  vancomycin (VANCOCIN) 1,250 mg in sodium chloride 0.9 % 250 mL IVPB  Status:  Discontinued     1,250 mg 166.7 mL/hr over 90 Minutes Intravenous  Once 06/11/16 0738 06/12/16 0858       Subjective: Pain is significantly improved. Is wondering about her placement.  Objective: Vitals:   06/14/16 1324 06/14/16 1934 06/15/16 0409 06/15/16 1322  BP: (!) 182/76 (!) 178/77 (!) 187/98 (!) 186/78  Pulse: 72 82 69 71  Resp: '16 15 18 16  '$ Temp:  98.5 F (36.9 C) 98.1 F (36.7 C) 97.6 F (36.4 C)  TempSrc:  Oral Oral Axillary  SpO2: 99% 95% 98% 98%  Weight:      Height:        Intake/Output Summary (Last 24 hours) at 06/15/16 1809 Last data filed at 06/15/16 1125  Gross per 24 hour  Intake                0 ml  Output              300 ml  Net             -300 ml   Filed Weights   06/11/16 0410 06/11/16 1040 06/11/16 1100  Weight: 67.1 kg (148 lb) 67.1 kg (148 lb) 68.6 kg (151 lb 4.8 oz)    Examination:  General exam: Alert, awake, oriented x 3 Respiratory system: Clear to auscultation. Respiratory effort normal. Cardiovascular system:RRR. No murmurs, rubs, gallops. Gastrointestinal system: mild abdominal pain with palpation Central nervous system: Alert and oriented. No focal neurological deficits. Extremities: No C/C/E, +pedal pulses Skin: No rashes, lesions or ulcers Psychiatry: Judgement and insight appear normal. Mood & affect appropriate.         Data Reviewed: I have personally reviewed following labs and imaging studies  CBC:  Recent Labs Lab 06/11/16 0450 06/12/16 0547 06/13/16 1519 06/14/16 0557 06/15/16 0651  WBC 32.2* 29.1* 22.7* 24.7* 22.4*  NEUTROABS 29.2*  --  19.3*  --   --   HGB 10.7* 8.7* 8.9* 9.6* 9.2*  HCT 33.1* 27.1* 28.0* 29.8* 28.7*  MCV 80.9 81.1 80.7 80.8 81.3  PLT 654* 594* 382 575* 536*   Basic Metabolic  Panel:  Recent Labs Lab 06/11/16 0450 06/12/16 0547 06/14/16 0557  NA 133* 133* 134*  K 4.1 3.8 3.2*  CL 98* 99* 101  CO2 '24 24 24  '$ GLUCOSE 101* 85 81  BUN '7 9 6  '$ CREATININE 0.49 0.44 0.44  CALCIUM 8.9 8.4* 8.4*   GFR: Estimated Creatinine Clearance: 75.6 mL/min (by C-G formula based on SCr of 0.44 mg/dL). Liver Function Tests:  Recent Labs Lab 06/11/16 0450  AST 13*  ALT 6*  ALKPHOS 94  BILITOT 0.5  PROT 7.4  ALBUMIN 2.5*    Recent Labs Lab 06/11/16 0450  LIPASE 12   No results for input(s): AMMONIA in the last 168 hours. Coagulation Profile: No results for input(s): INR, PROTIME in the last 168 hours. Cardiac Enzymes: No results for input(s): CKTOTAL, CKMB, CKMBINDEX, TROPONINI in the last 168 hours. BNP (last 3 results) No results for input(s): PROBNP in the last 8760 hours. HbA1C: No results for input(s): HGBA1C in the last 72 hours. CBG: No results for input(s): GLUCAP in the last 168 hours. Lipid Profile: No results for input(s): CHOL, HDL, LDLCALC, TRIG, CHOLHDL, LDLDIRECT in the last 72 hours. Thyroid Function Tests: No results for input(s): TSH, T4TOTAL, FREET4, T3FREE, THYROIDAB in the last 72 hours. Anemia Panel: No results for input(s): VITAMINB12, FOLATE, FERRITIN, TIBC, IRON, RETICCTPCT in the last 72 hours. Urine analysis:    Component Value Date/Time   COLORURINE AMBER (A) 06/11/2016 0615   APPEARANCEUR HAZY (A) 06/11/2016 0615   LABSPEC 1.045 (H) 06/11/2016 0615   PHURINE 5.0 06/11/2016 0615   GLUCOSEU NEGATIVE 06/11/2016 0615   HGBUR NEGATIVE 06/11/2016 0615   BILIRUBINUR MODERATE (A) 06/11/2016 0615   KETONESUR 5 (A) 06/11/2016 0615   PROTEINUR 30 (A) 06/11/2016 0615   NITRITE NEGATIVE 06/11/2016 0615   LEUKOCYTESUR LARGE (A) 06/11/2016 0615   Sepsis Labs: '@LABRCNTIP'$ (procalcitonin:4,lacticidven:4)  ) Recent Results (from the past 240 hour(s))  Urine culture     Status: None   Collection Time: 06/11/16  6:15 AM  Result Value  Ref Range Status   Specimen Description URINE, RANDOM  Final   Special Requests NONE  Final   Culture   Final    NO GROWTH Performed at [facility], [place] [address], [place], [place] [address]    Report Status 06/12/2016 FINAL  Final  Culture, blood (routine x 2)     Status: None (Preliminary result)   Collection Time: 06/11/16 12:15 PM  Result Value Ref Range Status   Specimen Description BLOOD RIGHT  HAND  Final   Special Requests   Final    BOTTLES DRAWN AEROBIC AND ANAEROBIC Blood Culture adequate volume   Culture NO GROWTH 4 DAYS  Final   Report Status PENDING  Incomplete  MRSA PCR Screening     Status: None   Collection Time: 06/11/16 11:45 PM  Result Value Ref Range Status   MRSA by PCR NEGATIVE NEGATIVE Final    Comment:        The GeneXpert MRSA Assay (FDA approved for NASAL specimens only), is one component of a comprehensive MRSA colonization surveillance program. It is not intended to diagnose MRSA infection nor to guide or monitor treatment for MRSA infections.          Radiology Studies: No results found.      Scheduled Meds: . doxycycline  100 mg Oral Q12H  . enoxaparin (LOVENOX) injection  40 mg Subcutaneous Q24H  . metroNIDAZOLE  500 mg Oral Q8H  . morphine  30 mg Oral Q12H  . potassium chloride  20 mEq Oral BID   Continuous Infusions: . sodium chloride 75 mL/hr at 06/15/16 1705     LOS: 4 days    Time spent: 25 minutes. Greater than 50% of this time was spent in direct contact with the patient coordinating care.     Lelon Frohlich, MD [facility] Pager [phone]  If 7PM-7AM, please contact night-coverage [email or web address] Password [email or web address] 06/15/2016, 6:09 PM

## 2016-06-15 NOTE — Consult Note (Signed)
Consultation Note Date: 06/15/2016   Patient Name: Valerie Gonzales  DOB: 08/05/1965  MRN: [id number]  Age / Sex: 51 y.o., female  PCP: Patient, No Pcp Per Referring Physician: Isaac Bliss, Olam Idler*  Reason for Consultation: Establishing goals of care and Psychosocial/spiritual support  HPI/Patient Profile: 51 y.o. female  with past medical history of Bipolar 1 disorder, history of lung cancer and uterine cancer, hypertension admitted on 06/11/2016 with endometrial cancer with metastatic burden.   Clinical Assessment and Goals of Care: Ms. Hackert is sitting up in her bed talking with chaplain Joya Gaskins. She greets me making and keeping eye contact. We talk about palliative medicine. We talk about her symptom management, no additional needs at this time.  We talk about disposition plan. I share with Mrs. Mulford doesn't she will likely need support, and encouraged her to go to rehab/SNF at least 4 a few weeks initially. I offer her choices of local SNFs, but she is unable/unwilling to allow her information to be sent to these facilities. She states instead that she would like to speak with her hospitalist, Dr. Jerilee Hoh, 1st.  There is a somewhat limited consultation today. Ms. Rucci has been overwhelmed by repeat visits from providers seeking to give her information and assist her in choices. Immediately prior to my visit she was visited by Education officer, museum, OB/GYN, and currently chaplain is at bedside. Ms. Mulkern is rightfully overwhelmed.  At this time we are working on disposition status as she is likely to discharge today or tomorrow. We do not discuss healthcare power of attorney or code status today.  Healthcare power of attorney NEXT OF KIN - we did not discuss healthcare power of attorney today. Next of kin would be legal decision maker. Antionette Poles is listed as contact in her chart.   SUMMARY OF  RECOMMENDATIONS   continue to treat the treatable. Follow-up with oncology for treatment options 5/10.  Code Status/Advance Care Planning:  Full code - we do not discuss code status today.  Symptom Management:   per hospitalist and gynecology, no additional needs at this time.   Palliative Prophylaxis:   Bowel Regimen and Frequent Pain Assessment  Additional Recommendations (Limitations, Scope, Preferences):  Full Scope Treatment  Psycho-social/Spiritual:   Desire for further Chaplaincy support:yes  Additional Recommendations: Crisis Intervention and Education on Hospice  Prognosis:   < 6 months, would not be surprising based on metastatic cancer burden, lack of access to care due to homelessness.   Discharge Planning: To be determined, Ms. Aguilera is somewhat resistant to SNF for rehab, concerned about her income.      Primary Diagnoses: Present on Admission: . Sepsis ([place]) . Malignant mixed Mullerian tumor (MMMT) ([place]) . Pyometrium . Leukocytosis   I have reviewed the medical record, interviewed the patient and family, and examined the patient. The following aspects are pertinent.  Past Medical History:  Diagnosis Date  . Bipolar 1 disorder ([place])   . Blood transfusion without reported diagnosis   . Cancer ([place])   . Hypertension   .  Lung cancer ([place])   . Malignant mixed Mullerian tumor (MMMT) ([place]) 06/11/2016  . Uterine cancer [facility])    Social History   Social History  . Marital status: Legally Separated    Spouse name: N/A  . Number of children: N/A  . Years of education: N/A   Social History Main Topics  . Smoking status: Current Every Day Smoker    Packs/day: 0.50    Types: Cigarettes  . Smokeless tobacco: Never Used  . Alcohol use No  . Drug use: No  . Sexual activity: Not Asked   Other Topics Concern  . None   Social History Narrative  . None   History reviewed. No pertinent family history. Scheduled Meds: . doxycycline  100 mg Oral Q12H    . enoxaparin (LOVENOX) injection  40 mg Subcutaneous Q24H  . metroNIDAZOLE  500 mg Oral Q8H  . morphine  30 mg Oral Q12H  . potassium chloride  20 mEq Oral BID   Continuous Infusions: . sodium chloride 75 mL/hr at 06/14/16 1858   PRN Meds:.acetaminophen **OR** acetaminophen, diphenhydrAMINE, morphine, ondansetron **OR** ondansetron (ZOFRAN) IV, senna-docusate Medications Prior to Admission:  Prior to Admission medications   Medication Sig Start Date End Date Taking? Authorizing Provider  acetaminophen (TYLENOL) 325 MG tablet Take 650 mg by mouth daily as needed for moderate pain.   Yes [provider]  docusate sodium (COLACE) 100 MG capsule Take 1 capsule (100 mg total) by mouth every 12 (twelve) hours. 06/01/16  Yes Mesner, Corene Cornea, MD  oxyCODONE-acetaminophen (PERCOCET) 5-325 MG tablet Take 1-2 tablets by mouth every 8 (eight) hours as needed. Patient taking differently: Take 1-2 tablets by mouth every 8 (eight) hours as needed for moderate pain.  06/01/16  Yes Mesner, Corene Cornea, MD   No Known Allergies Review of Systems  Unable to perform ROS: Other    Physical Exam  Constitutional: She is oriented to person, place, and time. No distress.  HENT:  Head: Normocephalic and atraumatic.  Cardiovascular: Normal rate and regular rhythm.   Pulmonary/Chest: Effort normal. No respiratory distress.  Abdominal: There is tenderness.  Musculoskeletal: She exhibits no edema.  Neurological: She is alert and oriented to person, place, and time.  Skin: Skin is warm and dry.  Nursing note and vitals reviewed.   Vital Signs: BP (!) 186/78 (BP Location: Right Arm)   Pulse 71   Temp 97.6 F (36.4 C) (Axillary)   Resp 16   Ht '5\' 3"'$  (1.6 m)   Wt 68.6 kg (151 lb 4.8 oz)   SpO2 98%   BMI 26.80 kg/m  Pain Assessment: No/denies pain POSS *See Group Information*: 1-Acceptable,Awake and alert Pain Score: 7    SpO2: SpO2: 98 % O2 Device:SpO2: 98 % O2 Flow Rate: .O2 Flow Rate (L/min): 2  L/min  IO: Intake/output summary:  Intake/Output Summary (Last 24 hours) at 06/15/16 1407 Last data filed at 06/15/16 1125  Gross per 24 hour  Intake                0 ml  Output              300 ml  Net             -300 ml    LBM: Last BM Date: 06/13/16 Baseline Weight: Weight: 67.1 kg (148 lb) Most recent weight: Weight: 68.6 kg (151 lb 4.8 oz)     Palliative Assessment/Data:   Flowsheet Rows     Most Recent Value  Intake Tab  Referral Department  Oncology  Unit at Time of Referral  Med/Surg Unit  Palliative Care Primary Diagnosis  Cancer  Date Notified  06/11/16  Palliative Care Type  New Palliative care  Reason for referral  Pain, Clarify Goals of Care  Date of Admission  06/11/16  Date first seen by Palliative Care  06/15/16  # of days Palliative referral response time  4 Day(s)  # of days IP prior to Palliative referral  0  Clinical Assessment  Palliative Performance Scale Score  60%  Pain Max last 24 hours  Not able to report  Pain Min Last 24 hours  Not able to report  Dyspnea Max Last 24 Hours  Not able to report  Dyspnea Min Last 24 hours  Not able to report  Psychosocial & Spiritual Assessment  Palliative Care Outcomes  Patient/Family meeting held?  Yes  Who was at the meeting?  Patient only  Palliative Care Outcomes  Provided psychosocial or spiritual support      Time In: 1140 Time Out: 1215 Time Total: 35 minutes Greater than 50%  of this time was spent counseling and coordinating care related to the above assessment and plan.  Signed by: Drue Novel, NP   Please contact Palliative Medicine Team phone at [phone] for questions and concerns.  For individual provider: See Shea Evans

## 2016-06-16 LAB — TYPE AND SCREEN
ABO/RH(D): O POS
ANTIBODY SCREEN: NEGATIVE
UNIT DIVISION: 0
Unit division: 0

## 2016-06-16 LAB — BPAM RBC
Blood Product Expiration Date: 201805242359
Blood Product Expiration Date: 201805242359
ISSUE DATE / TIME: 201805041013
UNIT TYPE AND RH: 5100
Unit Type and Rh: 5100

## 2016-06-16 LAB — CULTURE, BLOOD (ROUTINE X 2)
CULTURE: NO GROWTH
Special Requests: ADEQUATE

## 2016-06-16 MED ORDER — MORPHINE SULFATE 15 MG PO TABS: 15.0000 mg | ORAL_TABLET | ORAL | 0 refills | Status: AC | PRN

## 2016-06-16 MED ORDER — METRONIDAZOLE 500 MG PO TABS: 500.0000 mg | ORAL_TABLET | Freq: Three times a day (TID) | ORAL | 0 refills | Status: AC

## 2016-06-16 MED ORDER — MORPHINE SULFATE ER 15 MG PO TBCR: 45.0000 mg | EXTENDED_RELEASE_TABLET | Freq: Two times a day (BID) | ORAL | 0 refills | Status: AC

## 2016-06-16 MED ORDER — DOXYCYCLINE HYCLATE 100 MG PO TABS: 100.0000 mg | ORAL_TABLET | Freq: Two times a day (BID) | ORAL | 0 refills | Status: AC

## 2016-06-16 NOTE — Discharge Summary (Signed)
Physician Discharge Summary  Valerie Gonzales WVP:[id number] DOB: May 10, 1965 DOA: 06/11/2016  PCP: Patient, No Pcp Per  Admit date: 06/11/2016 Discharge date: 06/16/2016  Time spent: 45 minutes  Recommendations for Outpatient Follow-up:  -Will be discharged today. -SW has been actively searching for placement for her, however, she has refused all placement efforts stating that she already has a place to go to.  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 female with h/o bipolar disorder who is homeless and was recently diagnosed with metastatic endometrial cancer. She was admitted to [facility] recently for a ?UTI and signed out AMA. She comes back in today with the above complaints. She has not yet yet seen oncology. She has had a malodorous yellow vaginal discharge, along with low grade fevers. Her WBC count is 32.2. Seen in the ED by GYN, Dr. Glo Herring who is planning on performing a D and C in am for presumed pyometrium and requests admission for IV abx, oncology consultation and SW assistance for placement issues.  Hospital Course:   Pyometrium -S/p D and C 5/4 with Dr. Glo Herring. -Will transition over to PO abx to complete a 2 week course: doxy and flagyl. -WBCs improving  Mixed Mullerian Tumor -Seen by onc. -Has follow up in the office on May 10 at 8 am to determine treatment plan. -Pain is much improved on MS Contin and MSIR regimen. Based on PRN requirements over last 24 hours, will DC on MS Contin 45 mg BID and PRN MSIR 15 mg for breakthru  pain.  Social Issues -Mainly homelessness. -SW was looking into securing placement for her upon DC. Patient states today that she already has a place to go and is unwilling for [place] to pursue placement or transportation to her upcoming appointments. -I believe she will be lost to follow up. -She received a lot of support from myself, CM, SW, palliative care and chaplain in regards to allowing [place] to help her with her social issues.   Procedures:  D and C 5/4   Consultations:  [place]  Oncology  Discharge Instructions  Discharge Instructions    Increase activity slowly    Complete by:  As directed      Allergies as of 06/16/2016   No Known Allergies     Medication List    STOP taking these medications   oxyCODONE-acetaminophen 5-325 MG tablet Commonly known as:  PERCOCET     TAKE these medications   acetaminophen 325 MG tablet Commonly known as:  TYLENOL Take 650 mg by mouth daily as needed for moderate pain.   docusate sodium 100 MG capsule Commonly known as:  COLACE Take 1 capsule (100 mg total) by mouth every 12 (twelve) hours.   doxycycline 100 MG tablet Commonly known as:  VIBRA-TABS Take 1 tablet (100 mg total) by mouth every 12 (twelve) hours.   metroNIDAZOLE 500 MG tablet Commonly known as:  FLAGYL Take 1 tablet (500 mg total) by mouth every 8 (eight) hours.   morphine 15 MG  12 hr tablet Commonly known as:  MS CONTIN Take 3 tablets (45 mg total) by mouth every 12 (twelve) hours.   morphine 15 MG tablet Commonly known as:  MSIR Take 1 tablet (15 mg total) by mouth every 3 (three) hours as needed for severe pain.      No Known Allergies    The results of significant diagnostics from this hospitalization (including imaging, microbiology, ancillary and laboratory) are listed below for reference.    Significant Diagnostic Studies: Ct Chest W Contrast  Result Date: 06/01/2016 CLINICAL DATA:  Lower abdominal pain for 2-3 months. Vaginal  discharge. Nausea and weight loss. History of metastatic endometrial cancer, on treatment for palliative radiation. EXAM: CT CHEST, ABDOMEN, AND PELVIS WITH CONTRAST TECHNIQUE: Multidetector CT imaging of the chest, abdomen and pelvis was performed following the standard protocol during bolus administration of intravenous contrast. CONTRAST:  29m ISOVUE-300 IOPAMIDOL (ISOVUE-300) INJECTION 61%, 1031mISOVUE-300 IOPAMIDOL (ISOVUE-300) INJECTION 61% COMPARISON:  CT abdomen and pelvis May 17, 2016 FINDINGS: CT CHEST FINDINGS CARDIOVASCULAR: Heart size is normal. Small pericardial effusion. Thoracic aorta is normal course and caliber, unremarkable. MEDIASTINUM/NODES: No mediastinal mass. No lymphadenopathy by CT size criteria. Normal appearance of thoracic esophagus though not tailored for evaluation. LUNGS/PLEURA: Tracheobronchial tree is patent, no pneumothorax. At least 4 RIGHT and 9 LEFT hypoenhancing pulmonary metastasis, largest in RIGHT lower lobe measuring 5 x 4.2 cm, largest on the LEFT measuring 3.8 x 3 cm and lower lobes. Masses are predominately subpleural. No pleural effusion or focal consolidation. Bibasilar atelectasis. MUSCULOSKELETAL: Partially imaged breast demonstrate bulky density, possible masses. Moderate degenerative change of LEFT shoulder. No acute osseous process or destructive bony lesions. CT ABDOMEN AND PELVIS FINDINGS HEPATOBILIARY: Liver and gallbladder are normal. PANCREAS: Normal. SPLEEN: Normal. ADRENALS/URINARY TRACT: Kidneys are orthotopic, demonstrating symmetric enhancement. No nephrolithiasis, hydronephrosis or solid renal masses. Subcentimeter RIGHT lower pole probable an malalignment. The unopacified ureters are normal in course and caliber. Delayed imaging through the kidneys demonstrates symmetric prompt contrast excretion within the proximal urinary collecting system. Urinary bladder is partially distended and unremarkable. Normal adrenal glands. STOMACH/BOWEL: The stomach,  small and large bowel are normal in course and caliber without inflammatory changes. Normal appendix. VASCULAR/LYMPHATIC: Aortoiliac vessels are normal in course and caliber. Retroperitoneal lymphadenopathy, including 2.9 x 2.3 cm LEFT periaortic lymph node, smaller LEFT greater than RIGHT iliac chain lymph nodes. 3.3 cm LEFT sidewall lymph node with smaller bilateral internal and external iliac chain lymphadenopathy. REPRODUCTIVE: Fluid-filled vagina. Distended irregular endometrium to at least 7.2 cm with probable hematocrit level. Uterus is enlarged, at least 2.1 cm in cranial caudad dimension extending above the emboli kiss. Status post tubal ligation. Small calcified uterine leiomyoma. Probable 2.1 cm LEFT adnexal cyst. OTHER: No intraperitoneal free fluid or free air. MUSCULOSKELETAL: L5 lytic lesion with less than 25% ventral height loss, with new linear lucency through inferior endplate. Severe lower lumbar facet arthropathy. IMPRESSION: CT CHEST: Multiple old pulmonary metastasis without acute cardiopulmonary process. Possible breast masses, suspicious for metastatic disease. CT ABDOMEN AND PELVIS: Enlarged uterus with distended endometrium, increased from prior CT consistent with progression of patient's known primary endometrial carcinoma. Retroperitoneal/iliac chain lymphadenopathy consistent with metastatic disease. New mild L5 pathologic fracture. Electronically Signed   By: CoElon Alas.D.   On: 06/01/2016 15:52   Ct Abdomen Pelvis W Contrast  Result Date: 06/01/2016 CLINICAL DATA:  Lower abdominal pain for 2-3 months. Vaginal discharge. Nausea and weight loss. History of metastatic endometrial cancer, on treatment for palliative radiation. EXAM:  CT CHEST, ABDOMEN, AND PELVIS WITH CONTRAST TECHNIQUE: Multidetector CT imaging of the chest, abdomen and pelvis was performed following the standard protocol during bolus administration of intravenous contrast. CONTRAST:  28m ISOVUE-300 IOPAMIDOL  (ISOVUE-300) INJECTION 61%, 1041mISOVUE-300 IOPAMIDOL (ISOVUE-300) INJECTION 61% COMPARISON:  CT abdomen and pelvis May 17, 2016 FINDINGS: CT CHEST FINDINGS CARDIOVASCULAR: Heart size is normal. Small pericardial effusion. Thoracic aorta is normal course and caliber, unremarkable. MEDIASTINUM/NODES: No mediastinal mass. No lymphadenopathy by CT size criteria. Normal appearance of thoracic esophagus though not tailored for evaluation. LUNGS/PLEURA: Tracheobronchial tree is patent, no pneumothorax. At least 4 RIGHT and 9 LEFT hypoenhancing pulmonary metastasis, largest in RIGHT lower lobe measuring 5 x 4.2 cm, largest on the LEFT measuring 3.8 x 3 cm and lower lobes. Masses are predominately subpleural. No pleural effusion or focal consolidation. Bibasilar atelectasis. MUSCULOSKELETAL: Partially imaged breast demonstrate bulky density, possible masses. Moderate degenerative change of LEFT shoulder. No acute osseous process or destructive bony lesions. CT ABDOMEN AND PELVIS FINDINGS HEPATOBILIARY: Liver and gallbladder are normal. PANCREAS: Normal. SPLEEN: Normal. ADRENALS/URINARY TRACT: Kidneys are orthotopic, demonstrating symmetric enhancement. No nephrolithiasis, hydronephrosis or solid renal masses. Subcentimeter RIGHT lower pole probable an malalignment. The unopacified ureters are normal in course and caliber. Delayed imaging through the kidneys demonstrates symmetric prompt contrast excretion within the proximal urinary collecting system. Urinary bladder is partially distended and unremarkable. Normal adrenal glands. STOMACH/BOWEL: The stomach, small and large bowel are normal in course and caliber without inflammatory changes. Normal appendix. VASCULAR/LYMPHATIC: Aortoiliac vessels are normal in course and caliber. Retroperitoneal lymphadenopathy, including 2.9 x 2.3 cm LEFT periaortic lymph node, smaller LEFT greater than RIGHT iliac chain lymph nodes. 3.3 cm LEFT sidewall lymph node with smaller bilateral  internal and external iliac chain lymphadenopathy. REPRODUCTIVE: Fluid-filled vagina. Distended irregular endometrium to at least 7.2 cm with probable hematocrit level. Uterus is enlarged, at least 2.1 cm in cranial caudad dimension extending above the emboli kiss. Status post tubal ligation. Small calcified uterine leiomyoma. Probable 2.1 cm LEFT adnexal cyst. OTHER: No intraperitoneal free fluid or free air. MUSCULOSKELETAL: L5 lytic lesion with less than 25% ventral height loss, with new linear lucency through inferior endplate. Severe lower lumbar facet arthropathy. IMPRESSION: CT CHEST: Multiple old pulmonary metastasis without acute cardiopulmonary process. Possible breast masses, suspicious for metastatic disease. CT ABDOMEN AND PELVIS: Enlarged uterus with distended endometrium, increased from prior CT consistent with progression of patient's known primary endometrial carcinoma. Retroperitoneal/iliac chain lymphadenopathy consistent with metastatic disease. New mild L5 pathologic fracture. Electronically Signed   By: CoElon Alas.D.   On: 06/01/2016 15:52   Dg Abd Acute W/chest  Result Date: 06/11/2016 CLINICAL DATA:  Nausea vomiting and diarrhea for several months. Chest pain on tonight. EXAM: DG ABDOMEN ACUTE W/ 1V CHEST COMPARISON:  05/29/2016 FINDINGS: Multiple pulmonary nodules persist, probably unchanged. No airspace consolidation. No effusion. The abdominal gas pattern is normal.  No biliary or urinary calculi. IMPRESSION: Pulmonary metastases.  No consolidation or effusion. Normal abdominal gas pattern. Electronically Signed   By: DaAndreas Newport.D.   On: 06/11/2016 06:16    Microbiology: Recent Results (from the past 240 hour(s))  Urine culture     Status: None   Collection Time: 06/11/16  6:15 AM  Result Value Ref Range Status   Specimen Description URINE, RANDOM  Final   Special Requests NONE  Final   Culture   Final    NO GROWTH Performed at [facility]  [place] [address]    Report Status 06/12/2016 FINAL  Final  Culture, blood (routine x 2)     Status: None   Collection Time: 06/11/16 12:15 PM  Result Value Ref Range Status   Specimen Description BLOOD RIGHT HAND  Final   Special Requests   Final    BOTTLES DRAWN AEROBIC AND ANAEROBIC Blood Culture adequate volume   Culture NO GROWTH 5 DAYS  Final   Report Status 06/16/2016 FINAL  Final  MRSA PCR Screening     Status: None   Collection Time: 06/11/16 11:45 PM  Result Value Ref Range Status   MRSA by PCR NEGATIVE NEGATIVE Final    Comment:        The GeneXpert MRSA Assay (FDA approved for NASAL specimens only), is one component of a comprehensive MRSA colonization surveillance program. It is not intended to diagnose MRSA infection nor to guide or monitor treatment for MRSA infections.      Labs: Basic Metabolic Panel:  Recent Labs Lab 06/11/16 0450 06/12/16 0547 06/14/16 0557  NA 133* 133* 134*  K 4.1 3.8 3.2*  CL 98* 99* 101  CO2 '24 24 24  '$ GLUCOSE 101* 85 81  BUN '7 9 6  '$ CREATININE 0.49 0.44 0.44  CALCIUM 8.9 8.4* 8.4*   Liver Function Tests:  Recent Labs Lab 06/11/16 0450  AST 13*  ALT 6*  ALKPHOS 94  BILITOT 0.5  PROT 7.4  ALBUMIN 2.5*    Recent Labs Lab 06/11/16 0450  LIPASE 12   No results for input(s): AMMONIA in the last 168 hours. CBC:  Recent Labs Lab 06/11/16 0450 06/12/16 0547 06/13/16 1519 06/14/16 0557 06/15/16 0651  WBC 32.2* 29.1* 22.7* 24.7* 22.4*  NEUTROABS 29.2*  --  19.3*  --   --   HGB 10.7* 8.7* 8.9* 9.6* 9.2*  HCT 33.1* 27.1* 28.0* 29.8* 28.7*  MCV 80.9 81.1 80.7 80.8 81.3  PLT 654* 594* 382 575* 646*   Cardiac Enzymes: No results for input(s): CKTOTAL, CKMB, CKMBINDEX, TROPONINI in the last 168 hours. BNP: BNP (last 3 results) No results for input(s): BNP in the last 8760 hours.  ProBNP (last 3 results) No results for input(s): PROBNP in the last 8760 hours.  CBG: No results for input(s):  GLUCAP in the last 168 hours.     SignedLelon Frohlich  [facility] Pager: [phone] 06/16/2016, 5:37 PM

## 2016-06-16 NOTE — Progress Notes (Signed)
Pt refused morning meds and lab to draw blood.  Text paged Dr Jerilee Hoh via Shea Evans to notify.

## 2016-06-16 NOTE — Care Management Note (Signed)
Case Management Note  Patient Details  Name: Valerie Gonzales MRN: [id number] Date of Birth: [birth year]  Expected Discharge Date:  06/16/16               Expected Discharge Plan:  Home/Self Care  In-House Referral:  Clinical Social Work, Hospice / Palliative Care  Discharge planning Services  CM Consult  Post Acute Care Choice:  NA Choice offered to:  NA  Status of Service:  Completed, signed off  If discussed at [place] of Stay Meetings, dates discussed:  06/16/2016  Additional Comments: Pt discharging home today. Per CSW pt states she has somewhere to go. Pt has many social problems but no HH or DME needed at DC. Pt has f/u with specialty providers.   Sherald Barge, RN 06/16/2016, 11:30 AM

## 2016-06-16 NOTE — Progress Notes (Signed)
Reviewed pt discharge instructions with pt and answered questions at this time.  Pt discharged with prescriptions and belongings.

## 2016-06-16 NOTE — Progress Notes (Signed)
Patient c/o nausea but does not want Zofran. Patient refused first attempt of VS by Tiffany, NT. Patient cooperative taking Flagyl PO this AM and agreed for RN to check VS on second attempt. Patient refused to get labs drawn d/t nausea. 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 Will notify next shift RN. Patient encouraged to notify staff if she decides to take Zofran.

## 2016-06-16 NOTE — Progress Notes (Addendum)
Patient refusing VS. Tiffany, NT attempted x2 different times. Patient agreed on third attempt. Patient refusing SCD's. Patient up and down to bathroom independently. Patient refused potassium. This RN educated patient of low potassium levels and offered to dissolve med in water. Patient still refused.

## 2016-06-16 NOTE — Progress Notes (Signed)
NT in to remove pt IV for discharge, pt had removed IV by them self.

## 2016-06-16 NOTE — Care Management Important Message (Signed)
Important Message  Patient Details  Name: Valerie Gonzales MRN: [id number] Date of Birth: [birth year]   Medicare Important Message Given:  Yes    Sherald Barge, RN 06/16/2016, 11:30 AM

## 2016-06-16 NOTE — Clinical Social Work Note (Signed)
Patient advised that she had found a place to go and had to be there at 2:00 p.m. She would not disclose where she had found to live.  She would only state "I have a place." She requested to be discharged so that she could move to her new living arrangement.   LCSW signing off.       Jodine Muchmore, Clydene Pugh, LCSW

## 2016-06-18 ENCOUNTER — Ambulatory Visit: Payer: Medicare Other | Admitting: Gynecologic Oncology

## 2016-06-18 ENCOUNTER — Ambulatory Visit (HOSPITAL_COMMUNITY): Payer: Medicare Other

## 2016-08-09 DEATH — deceased

## 2017-09-06 IMAGING — CT CT ABD-PELV W/ CM
2 of 5 series · 12 of 36 positions shown, 15 images · IV contrast (Isovue)
Comparison: CT abdomen and pelvis May 17, 2016

CLINICAL DATA: Lower abdominal pain for 2-3 months. Vaginal
discharge. Nausea and weight loss. History of metastatic endometrial
cancer, on treatment for palliative radiation.

EXAM:
CT CHEST, ABDOMEN, AND PELVIS WITH CONTRAST
TECHNIQUE: Multidetector CT imaging of the chest, abdomen and pelvis was
performed following the standard protocol during bolus
administration of intravenous contrast.
CONTRAST:  30mL DMSTXD-3YY IOPAMIDOL (DMSTXD-3YY) INJECTION 61%,
100mL DMSTXD-3YY IOPAMIDOL (DMSTXD-3YY) INJECTION 61%

[Series 2: cap with · axial · 0.71mm/px · z∈[+869,+1334]mm · 9 of 117 slices shown, 12 images]
[im 12/117  mediastinal]
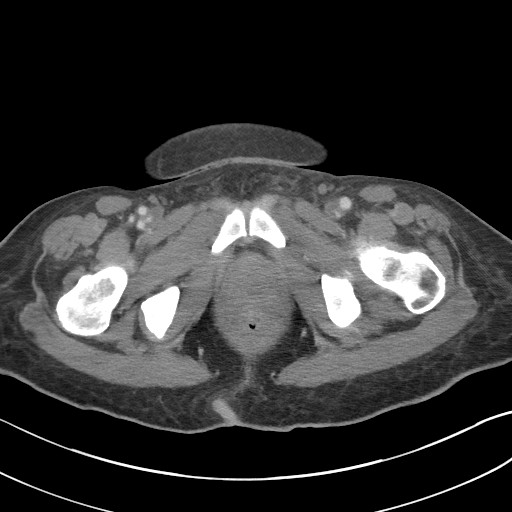
[im 12/117  lung]
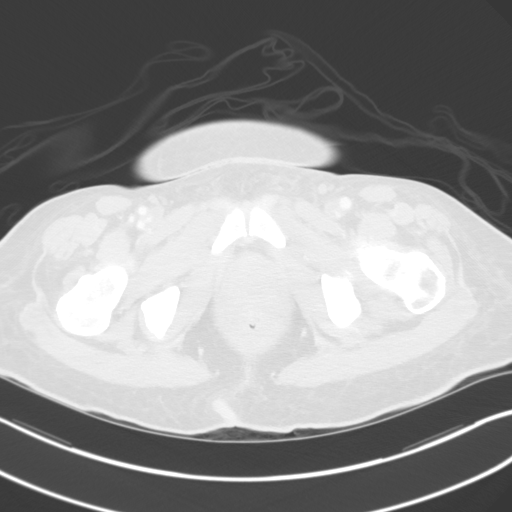
[im 24/117  lung]
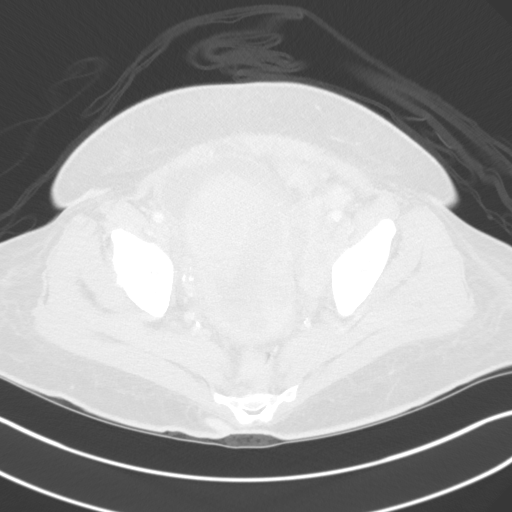
[im 35/117  lung]
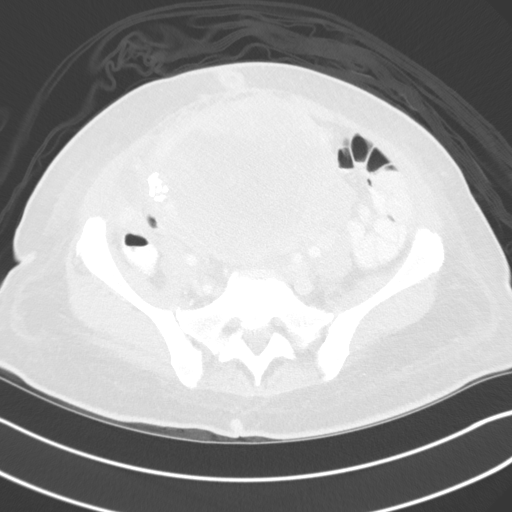
[im 47/117  lung]
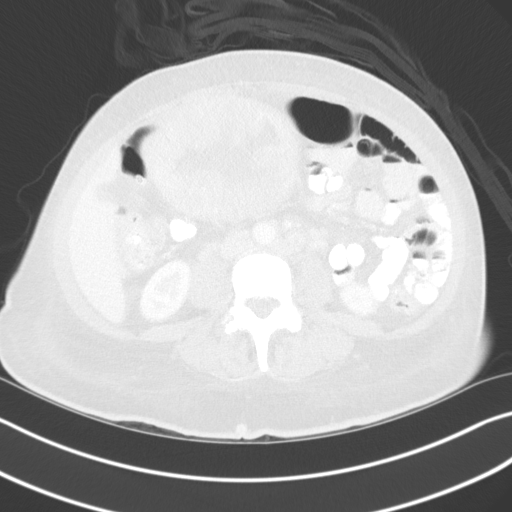
[im 59/117  mediastinal]
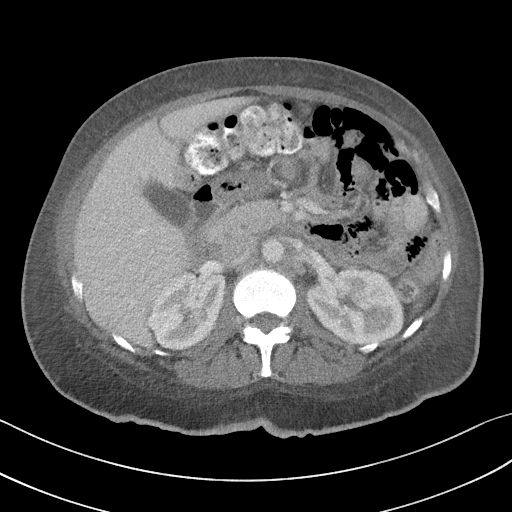
[im 59/117  lung]
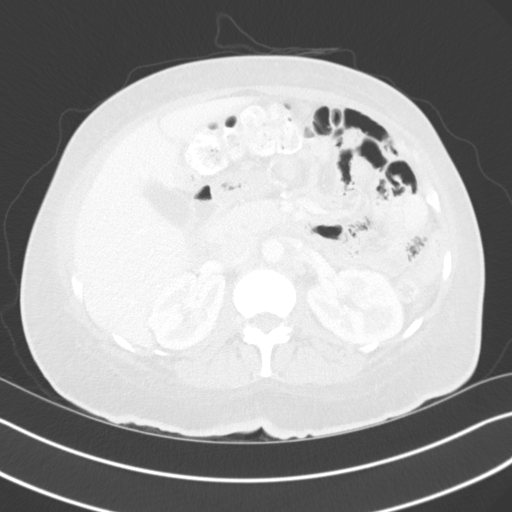
[im 70/117  lung]
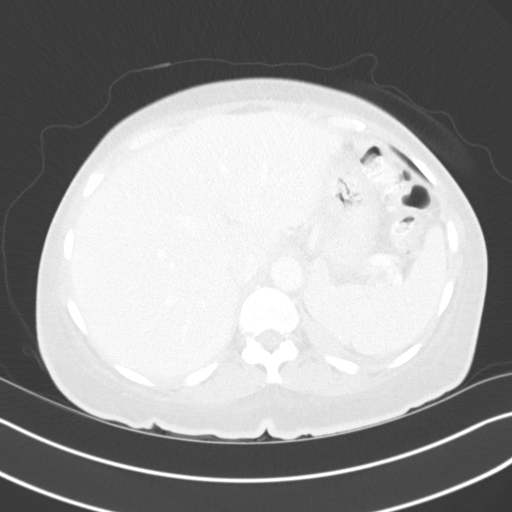
[im 82/117  lung]
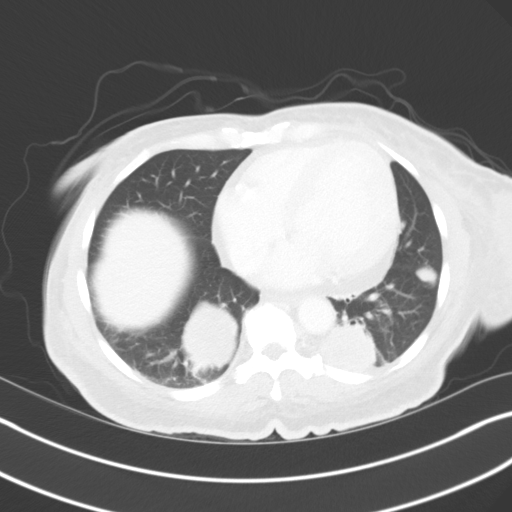
[im 93/117  lung]
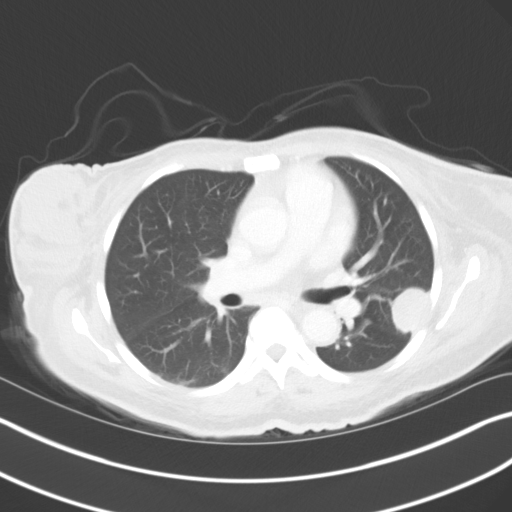
[im 105/117  mediastinal]
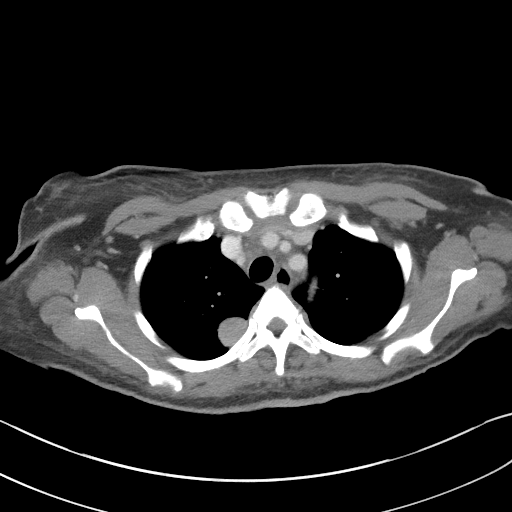
[im 105/117  lung]
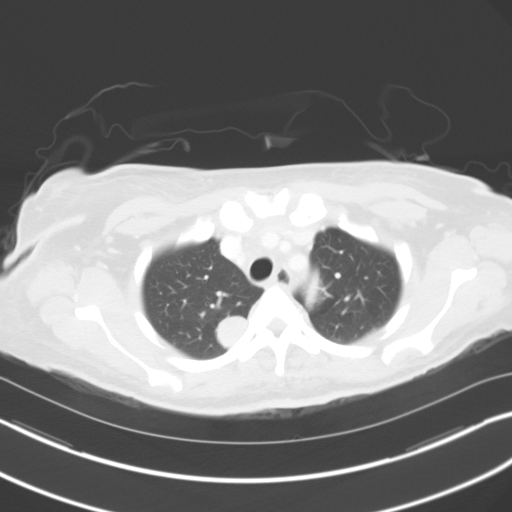

[Series 5: coronals · coronal · 0.70mm/px · 3 of 141 slices shown]
[im 29/141  lung]
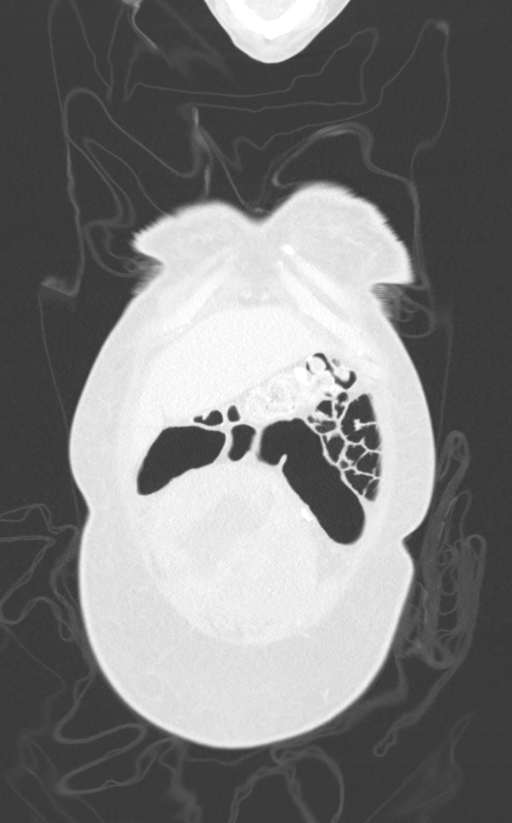
[im 57/141  lung]
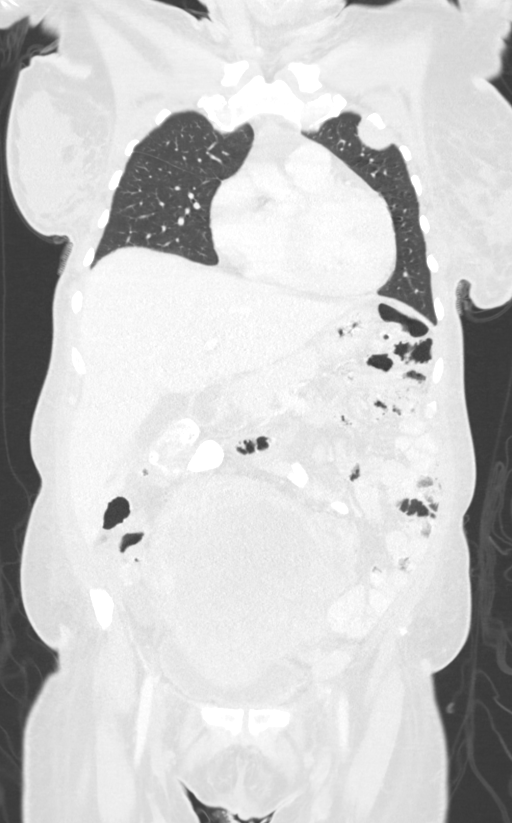
[im 85/141  lung]
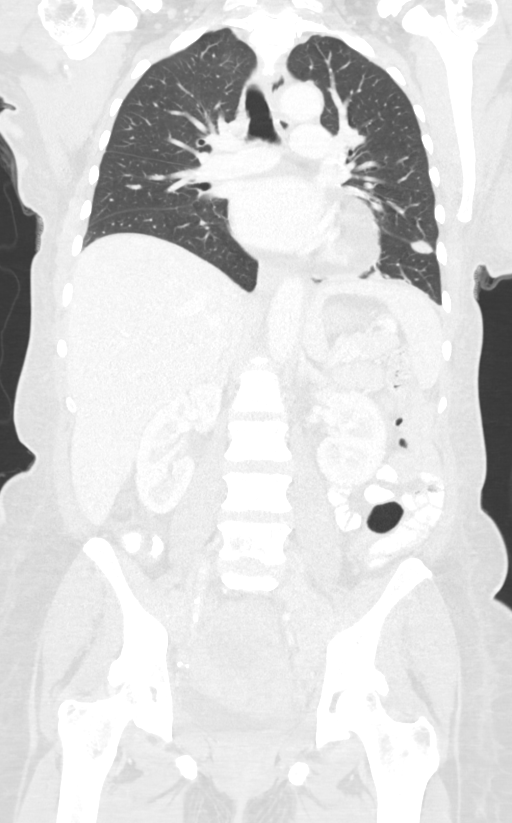

[12 of 36 positions shown; findings below may reference images not displayed]

FINDINGS: CT CHEST FINDINGS

CARDIOVASCULAR: Heart size is normal. Small pericardial effusion.
Thoracic aorta is normal course and caliber, unremarkable.

MEDIASTINUM/NODES: No mediastinal mass. No lymphadenopathy by CT
size criteria. Normal appearance of thoracic esophagus though not
tailored for evaluation.

LUNGS/PLEURA: Tracheobronchial tree is patent, no pneumothorax. At
least 4 RIGHT and 9 LEFT hypoenhancing pulmonary metastasis, largest
in RIGHT lower lobe measuring 5 x 4.2 cm, largest on the LEFT
measuring 3.8 x 3 cm and lower lobes. Masses are predominately
subpleural. No pleural effusion or focal consolidation. Bibasilar
atelectasis.

MUSCULOSKELETAL: Partially imaged breast demonstrate bulky density,
possible masses. Moderate degenerative change of LEFT shoulder. No
acute osseous process or destructive bony lesions.

CT ABDOMEN AND PELVIS FINDINGS

HEPATOBILIARY: Liver and gallbladder are normal.

PANCREAS: Normal.

SPLEEN: Normal.

ADRENALS/URINARY TRACT: Kidneys are orthotopic, demonstrating
symmetric enhancement. No nephrolithiasis, hydronephrosis or solid
renal masses. Subcentimeter RIGHT lower pole probable an
malalignment. The unopacified ureters are normal in course and
caliber. Delayed imaging through the kidneys demonstrates symmetric
prompt contrast excretion within the proximal urinary collecting
system. Urinary bladder is partially distended and unremarkable.
Normal adrenal glands.

STOMACH/BOWEL: The stomach, small and large bowel are normal in
course and caliber without inflammatory changes. Normal appendix.

VASCULAR/LYMPHATIC: Aortoiliac vessels are normal in course and
caliber. Retroperitoneal lymphadenopathy, including 2.9 x 2.3 cm
LEFT periaortic lymph node, smaller LEFT greater than RIGHT iliac
chain lymph nodes. 3.3 cm LEFT sidewall lymph node with smaller
bilateral internal and external iliac chain lymphadenopathy.

REPRODUCTIVE: Fluid-filled vagina. Distended irregular endometrium
to at least 7.2 cm with probable hematocrit level. Uterus is
enlarged, at least 2.1 cm in cranial caudad dimension extending
above the emboli kiss. Status post tubal ligation. Small calcified
uterine leiomyoma. Probable 2.1 cm LEFT adnexal cyst.

OTHER: No intraperitoneal free fluid or free air.

MUSCULOSKELETAL: L5 lytic lesion with less than 25% ventral height
loss, with new linear lucency through inferior endplate. Severe
lower lumbar facet arthropathy.
IMPRESSION: CT CHEST: Multiple old pulmonary metastasis without acute
cardiopulmonary process.

Possible breast masses, suspicious for metastatic disease.

CT ABDOMEN AND PELVIS: Enlarged uterus with distended endometrium,
increased from prior CT consistent with progression of patient's
known primary endometrial carcinoma. Retroperitoneal/iliac chain
lymphadenopathy consistent with metastatic disease.

New mild L5 pathologic fracture.

## 2017-09-16 IMAGING — DX DG ABDOMEN ACUTE W/ 1V CHEST
3 series · 3 of 3 positions shown · non-contrast
Comparison: 05/29/2016

CLINICAL DATA: Nausea vomiting and diarrhea for several months.
Chest pain on tonight.

EXAM:
DG ABDOMEN ACUTE W/ 1V CHEST

[chest pa]
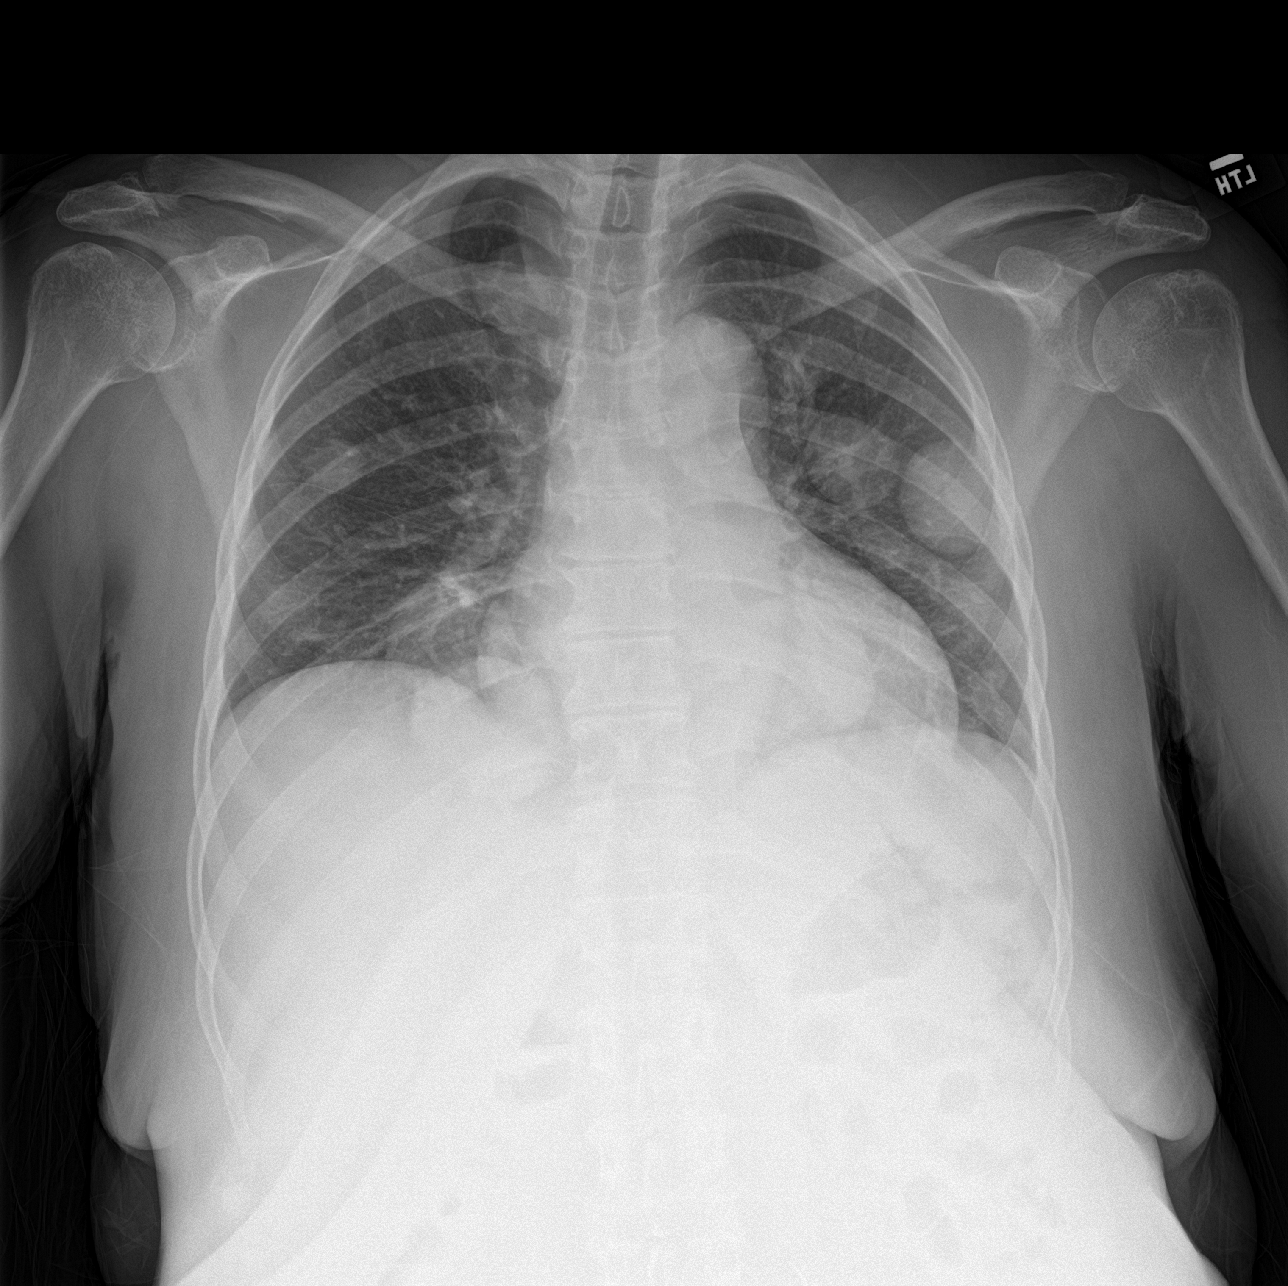

[abdomen erect]
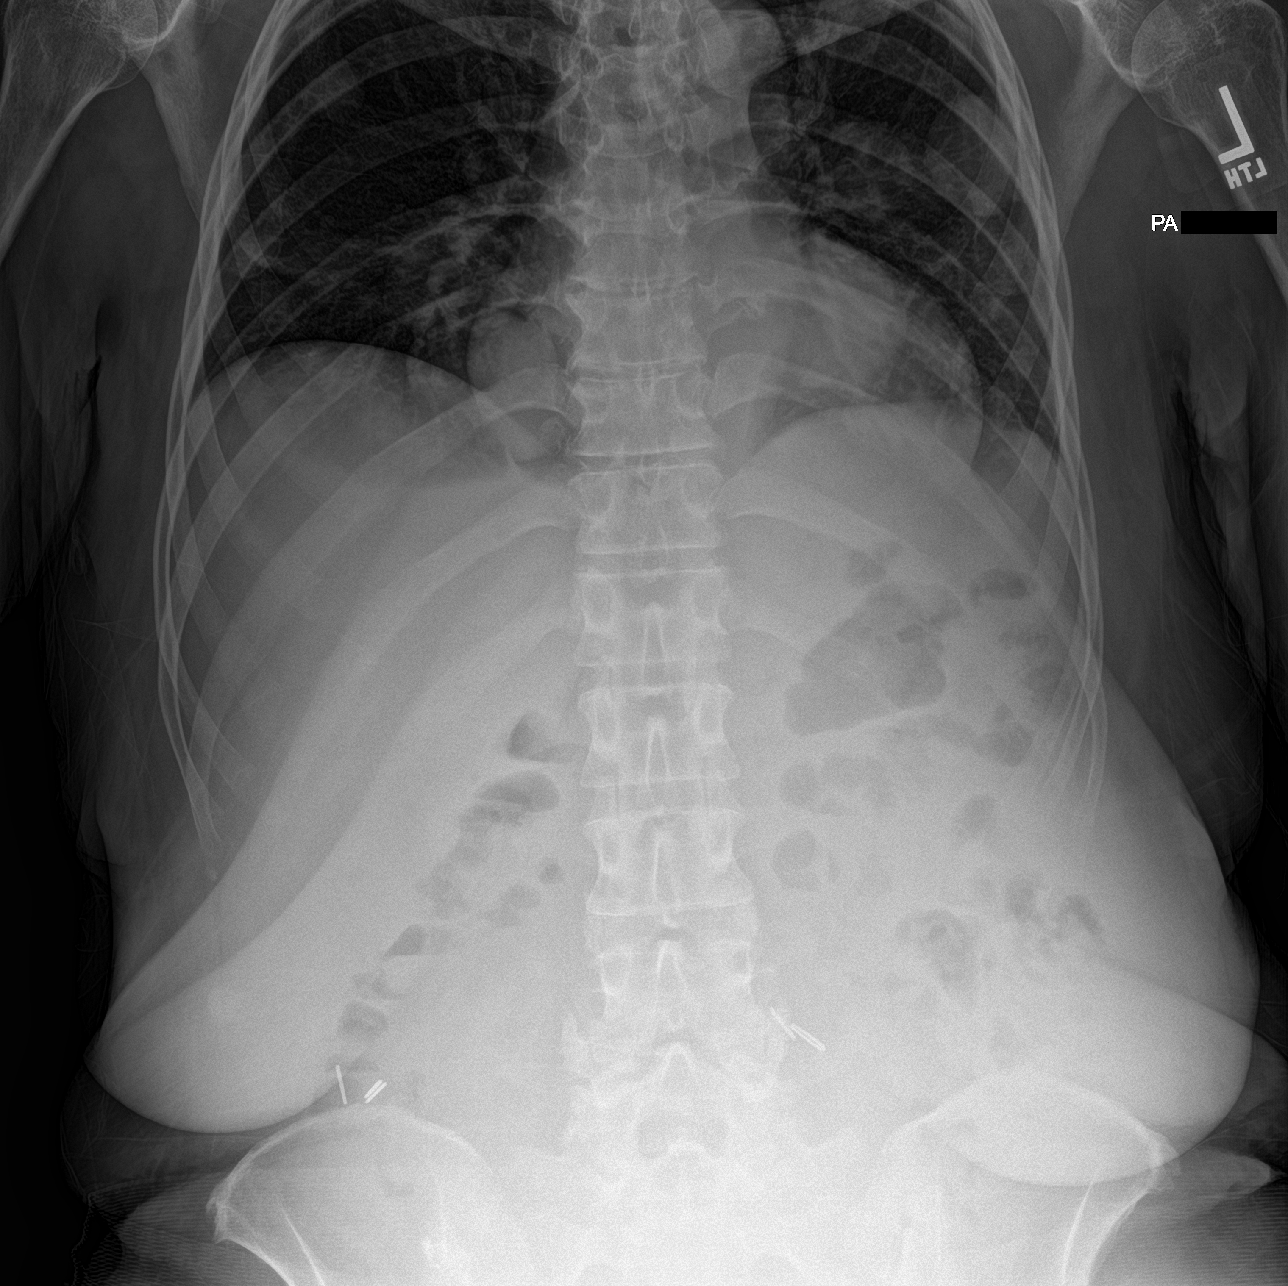

[abdomen supine]
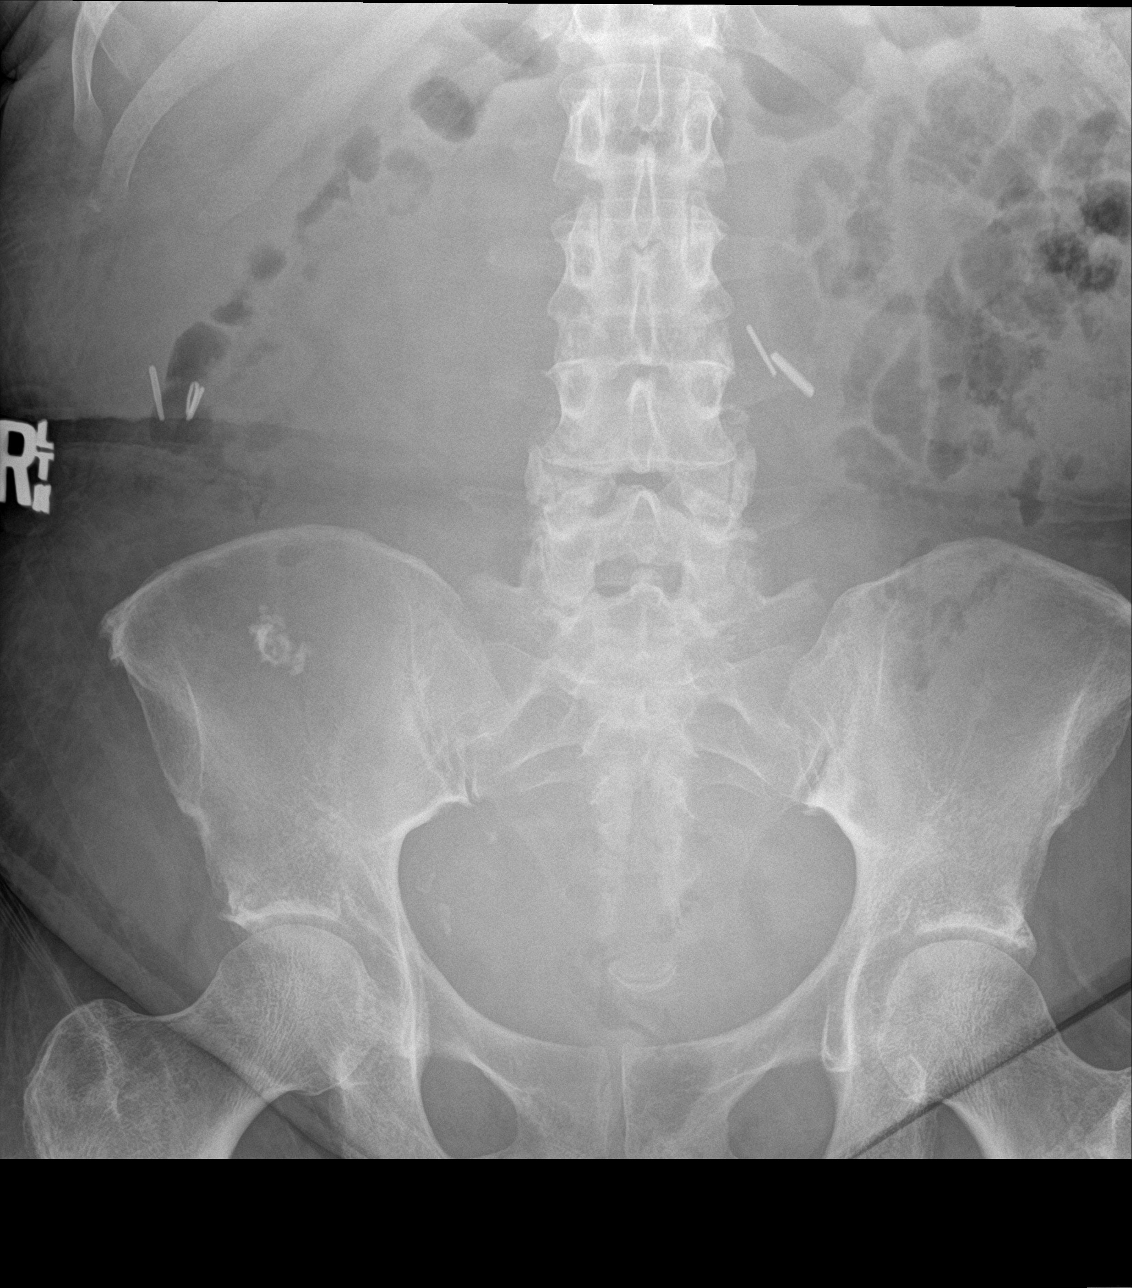

[3 of 3 positions shown; findings below may reference images not displayed]

FINDINGS: Multiple pulmonary nodules persist, probably unchanged. No airspace
consolidation. No effusion.

The abdominal gas pattern is normal.  No biliary or urinary calculi.
IMPRESSION: Pulmonary metastases.  No consolidation or effusion.

Normal abdominal gas pattern.
